# Patient Record
Sex: Male | Born: 1965 | Race: White | Hispanic: No | Marital: Married | Smoking: Current every day smoker
Health system: Southern US, Community
[De-identification: ages and names within clinical notes are randomized; demographics above are authoritative.]

## PROBLEM LIST (undated history)

## (undated) DIAGNOSIS — G40909 Epilepsy, unspecified, not intractable, without status epilepticus: Secondary | ICD-10-CM

## (undated) DIAGNOSIS — S069XAA Unspecified intracranial injury with loss of consciousness status unknown, initial encounter: Secondary | ICD-10-CM

## (undated) DIAGNOSIS — R51 Headache: Secondary | ICD-10-CM

## (undated) DIAGNOSIS — N529 Male erectile dysfunction, unspecified: Secondary | ICD-10-CM

## (undated) DIAGNOSIS — S069X9A Unspecified intracranial injury with loss of consciousness of unspecified duration, initial encounter: Secondary | ICD-10-CM

## (undated) DIAGNOSIS — F419 Anxiety disorder, unspecified: Secondary | ICD-10-CM

## (undated) HISTORY — PX: HEMORROIDECTOMY: SUR656

## (undated) HISTORY — DX: Headache: R51

## (undated) HISTORY — DX: Male erectile dysfunction, unspecified: N52.9

## (undated) HISTORY — DX: Unspecified intracranial injury with loss of consciousness of unspecified duration, initial encounter: S06.9X9A

## (undated) HISTORY — PX: OTHER SURGICAL HISTORY: SHX169

## (undated) HISTORY — DX: Anxiety disorder, unspecified: F41.9

## (undated) HISTORY — DX: Unspecified intracranial injury with loss of consciousness status unknown, initial encounter: S06.9XAA

## (undated) HISTORY — DX: Epilepsy, unspecified, not intractable, without status epilepticus: G40.909

---

## 2013-11-04 ENCOUNTER — Ambulatory Visit: Payer: Self-pay | Admitting: Neurology

## 2017-06-01 ENCOUNTER — Encounter: Payer: Self-pay | Admitting: Neurology

## 2017-06-01 ENCOUNTER — Ambulatory Visit: Payer: Medicaid Other | Admitting: Neurology

## 2017-06-01 DIAGNOSIS — G40909 Epilepsy, unspecified, not intractable, without status epilepticus: Secondary | ICD-10-CM

## 2017-06-01 DIAGNOSIS — R519 Headache, unspecified: Secondary | ICD-10-CM

## 2017-06-01 DIAGNOSIS — R51 Headache: Secondary | ICD-10-CM

## 2017-06-01 DIAGNOSIS — G441 Vascular headache, not elsewhere classified: Secondary | ICD-10-CM

## 2017-06-01 HISTORY — DX: Epilepsy, unspecified, not intractable, without status epilepticus: G40.909

## 2017-06-01 HISTORY — DX: Headache, unspecified: R51.9

## 2017-06-01 MED ORDER — IBUPROFEN 600 MG PO TABS: 600.0000 mg | ORAL_TABLET | Freq: Four times a day (QID) | ORAL | 1 refills | Status: DC | PRN

## 2017-06-01 MED ORDER — TOPIRAMATE 25 MG PO TABS: ORAL_TABLET | ORAL | 3 refills | Status: DC
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
## 2017-06-01 NOTE — Patient Instructions (Addendum)
   We will get MRI of the brain and get an EEG study.  We will start Topamax for the seizures and the headache.  Topamax (topiramate) is a seizure medication that has an FDA approval for seizures and for migraine headache. Potential side effects of this medication include weight loss, cognitive slowing, tingling in the fingers and toes, and carbonated drinks will taste bad. If any significant side effects are noted on this drug, please contact our office.

## 2017-06-12 ENCOUNTER — Telehealth: Payer: Self-pay | Admitting: Neurology

## 2017-06-12 NOTE — Telephone Encounter (Signed)
Patient has had recent blood work done through [facility].  The blood work was done on 21 December 2016.  Blood work includes a TSH of 1.29, white blood count of 5.3, hemoglobin 16.7, hematocrit 5 0.0, MCV 101, platelets of 179, PSA of 1.40, glucose of 88, BUN of 18, creatinine of 0.91, sodium 141, potassium 4.5, chloride 100, CO2 24, calcium 9.5, total protein of 7.0, albumin of 4.6, alkaline phosphatase of 112, AST 21, ALT 25.

## 2017-06-15 ENCOUNTER — Telehealth: Payer: Self-pay | Admitting: Neurology

## 2017-06-15 NOTE — Telephone Encounter (Signed)
I called the patient.  Left a message, I will call back later.

## 2017-06-15 NOTE — Telephone Encounter (Signed)
Pts wife [name] called saying that ibuprofen (ADVIL,MOTRIN) 600 MG tablet isn't  touching the pts headaches. Pt is wanting to up his dosage up to 800MG  or get something new

## 2017-06-15 NOTE — Telephone Encounter (Signed)
I called again, left a message again, I will call back tomorrow morning.

## 2017-06-15 NOTE — Telephone Encounter (Signed)
Pts wife is on break from 12-12:15 then at 2:15-2:30 and again at 4:30-4:45, if calling back today theses time frames would be ideal

## 2017-06-16 MED ORDER — IBUPROFEN 800 MG PO TABS: 800.0000 mg | ORAL_TABLET | Freq: Three times a day (TID) | ORAL | 2 refills | Status: DC | PRN

## 2017-06-16 NOTE — Addendum Note (Signed)
Addended by: Kathrynn Ducking on: 06/16/2017 07:41 AM   Modules accepted: Orders

## 2017-06-16 NOTE — Telephone Encounter (Signed)
I called the patient, talk with wife.  It appears that the patient is not just on one phenobarbital tablet daily, he takes 1 tablet 4 times daily, this will be change in the records.  The patient is to continue the phenobarbital.  The patient has not yet gotten up to the full dose of Topamax, he is still having headaches, hopefully the Topamax will help this with a higher dose, if not, they are to contact us for dose adjustments of the Topamax.  We will increase the ibuprofen to 800 mg tablets.  MRI of the brain is pending.

## 2017-06-21 ENCOUNTER — Other Ambulatory Visit: Payer: Medicaid Other

## 2017-07-05 ENCOUNTER — Encounter (INDEPENDENT_AMBULATORY_CARE_PROVIDER_SITE_OTHER): Payer: Self-pay

## 2017-07-05 ENCOUNTER — Ambulatory Visit: Payer: Medicaid Other | Admitting: Neurology

## 2017-07-05 ENCOUNTER — Telehealth: Payer: Self-pay | Admitting: Neurology

## 2017-07-05 DIAGNOSIS — G40909 Epilepsy, unspecified, not intractable, without status epilepticus: Secondary | ICD-10-CM

## 2017-07-05 MED ORDER — KETOROLAC TROMETHAMINE 10 MG PO TABS: 10.0000 mg | ORAL_TABLET | Freq: Three times a day (TID) | ORAL | 1 refills | Status: DC | PRN

## 2017-07-05 NOTE — Telephone Encounter (Signed)
I called the patient.  The EEG study shows intermittent bifrontal slowing.  MRI of the brain is pending.  He continues to have frequent headaches, he claims that the ibuprofen is not helpful.  The patient will be switched to Toradol to see if that helps.
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
## 2017-07-06 MED ORDER — DICLOFENAC POTASSIUM(MIGRAINE) 50 MG PO PACK: 50.0000 mg | PACK | Freq: Three times a day (TID) | ORAL | 2 refills | Status: DC | PRN

## 2017-07-06 NOTE — Telephone Encounter (Signed)
I called the patient.  The Toradol was too expensive, I will try diclofenac potassium 50 mg tablets to see if this helps.  They asked for a prescription for alprazolam, I do not write this prescription, this likely comes through his primary care physician.

## 2017-07-06 NOTE — Addendum Note (Signed)
Addended by: Kathrynn Ducking on: 07/06/2017 03:43 PM   Modules accepted: Orders

## 2017-07-06 NOTE — Telephone Encounter (Signed)
Toradol is going to [place] pt $30 and pts wife is wanting something cheaper or something that will be cover sent in. Pts wife is wondering if Dr. Jannifer Franklin will send in Xanax a little stronger dosage than .5 because they do work for pts headache and help pt sleep.

## 2017-07-10 ENCOUNTER — Ambulatory Visit
Admission: RE | Admit: 2017-07-10 | Discharge: 2017-07-10 | Disposition: A | Discharge status: Home / Self Care | Payer: Medicaid Other | Source: Ambulatory Visit | Attending: Neurology | Admitting: Neurology

## 2017-07-10 ENCOUNTER — Telehealth: Payer: Self-pay | Admitting: Neurology

## 2017-07-10 DIAGNOSIS — G40909 Epilepsy, unspecified, not intractable, without status epilepticus: Secondary | ICD-10-CM

## 2017-07-10 MED ORDER — GADOBENATE DIMEGLUMINE 529 MG/ML IV SOLN
13.0000 mL | Freq: Once | INTRAVENOUS | Status: AC | PRN
  Administered 2017-07-10: 13 mL via INTRAVENOUS

## 2017-07-10 NOTE — Telephone Encounter (Signed)
I called the patient.  The MRI of the brain is relatively unremarkable.  He is to remain on Topamax for the seizures, he continues to have daily headaches, I asked him to stop taking the [place] powders as this may be causing rebound headaches.  I indicated that opiate medications are contraindicated as he is having daily headaches, this would likely result in opiate addiction.

## 2017-08-19 ENCOUNTER — Other Ambulatory Visit: Payer: Self-pay | Admitting: Neurology

## 2017-09-19 ENCOUNTER — Telehealth: Payer: Self-pay | Admitting: Adult Health

## 2017-09-19 ENCOUNTER — Ambulatory Visit: Payer: Medicaid Other | Admitting: Adult Health

## 2017-09-19 ENCOUNTER — Encounter: Payer: Self-pay | Admitting: Adult Health

## 2017-09-19 ENCOUNTER — Other Ambulatory Visit: Payer: Self-pay | Admitting: Neurology

## 2017-09-19 VITALS — BP 140/88 | HR 60 | Ht 71.0 in | Wt 137.0 lb

## 2017-09-19 DIAGNOSIS — R51 Headache: Secondary | ICD-10-CM | POA: Diagnosis not present

## 2017-09-19 DIAGNOSIS — G40909 Epilepsy, unspecified, not intractable, without status epilepticus: Secondary | ICD-10-CM | POA: Diagnosis not present

## 2017-09-19 DIAGNOSIS — R519 Headache, unspecified: Secondary | ICD-10-CM

## 2017-09-19 MED ORDER — DICLOFENAC POTASSIUM(MIGRAINE) 50 MG PO PACK: 50.0000 mg | PACK | Freq: Three times a day (TID) | ORAL | 5 refills | Status: DC | PRN

## 2017-09-19 MED ORDER — TOPIRAMATE 25 MG PO TABS: 75.0000 mg | ORAL_TABLET | Freq: Two times a day (BID) | ORAL | 11 refills | Status: DC

## 2017-09-19 NOTE — Telephone Encounter (Signed)
I have called the pt's wife and made her aware that I have sent this med to the pharmacy she requested for a 6 mth supply.  There was no answer, LVM for the pt

## 2017-09-19 NOTE — Patient Instructions (Signed)
Your Plan:  Increase Topamax 75 mg twice a day Continue Diclofenac  If your symptoms worsen or you develop new symptoms please let us know.   Thank you for coming to see [place] at [facility]. I hope we have been able to provide you high quality care today.  You may receive a patient satisfaction survey over the next few weeks. We would appreciate your feedback and comments so that we may continue to improve ourselves and the health of our patients.

## 2017-09-19 NOTE — Progress Notes (Signed)
I have read the note, and I agree with the clinical assessment and plan.  Charles K Willis   

## 2017-09-19 NOTE — Telephone Encounter (Signed)
Pt wife requesting a refill for Diclofenac Potassium 50 MG PACK sent to [facility] for 6 months refill. Also requesting a call to make sure this problem is solved
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
## 2017-10-18 ENCOUNTER — Telehealth: Payer: Self-pay | Admitting: Adult Health

## 2017-10-18 NOTE — Telephone Encounter (Signed)
Called and spoke with patient's wife, [name] on [place] and advised her that her husband needs to call surgeon for approval off whether he can take any OTC for headaches. Advised her that NP will not recommend any medications. Wife stated he will take Goody powders which Dr Jannifer Franklin told him not to take. This RN advised that it a blood thinner and if Dr Jannifer Franklin told him not to take it, he shouldn't. She stated Tylenol doesn't help him. This RN advised again he call surgeon and follow surgeon's instructions.  Wife stated she will call surgeon, verbalized understanding.

## 2017-10-18 NOTE — Telephone Encounter (Signed)
That's correct. He can consult with his hand surgeon about the use of OTC medication prior to surgery

## 2017-10-18 NOTE — Telephone Encounter (Signed)
Pts wife called stating the pt will be having hand surgery on 5/24 and had been advised to come off of Diclofenac Potassium 50 MG PACK 5 days prior to surgery. [name] requesting a call back to see if there is anything the pt could take to help with his migraines with in the 5 day period. Please call to advise

## 2018-03-06 ENCOUNTER — Telehealth: Payer: Self-pay | Admitting: Adult Health

## 2018-03-06 ENCOUNTER — Other Ambulatory Visit: Payer: Self-pay | Admitting: *Deleted

## 2018-03-06 MED ORDER — DICLOFENAC SODIUM 50 MG PO TBEC: 50.0000 mg | DELAYED_RELEASE_TABLET | Freq: Three times a day (TID) | ORAL | 2 refills | Status: DC | PRN

## 2018-03-06 MED ORDER — DICLOFENAC POTASSIUM(MIGRAINE) 50 MG PO PACK: 50.0000 mg | PACK | Freq: Three times a day (TID) | ORAL | 2 refills | Status: DC | PRN

## 2018-03-06 NOTE — Addendum Note (Signed)
Addended by: Brandon Melnick on: 03/06/2018 03:07 PM   Modules accepted: Orders

## 2018-03-06 NOTE — Telephone Encounter (Signed)
I spoke to nancy at [facility].  No diclofenac packs.  Changed to 50mg  tablets ( diclofenac sodium) 1 tablet po TID prn # 90 with 2 refills.

## 2018-03-06 NOTE — Telephone Encounter (Signed)
Pt wife(on [place], [name]) has called for a refill on pt's Diclofenac Potassium 50 MG PACK, wife states pt will run out of this medication before scheduled appointment on 10-23, please send to  [place], [place] [facility] [place] [phone] (Phone) [phone] (Fax)

## 2018-03-06 NOTE — Telephone Encounter (Signed)
Nancy/[facility] [phone] does not have pack. Can this be swapped out for 50mg  tablets of voltaren? Please call to advise

## 2018-03-07 NOTE — Telephone Encounter (Signed)
Noted! Thank you

## 2018-03-07 NOTE — Addendum Note (Signed)
Addended by: Brandon Melnick on: 03/07/2018 11:14 AM   Modules accepted: Orders

## 2018-03-20 ENCOUNTER — Telehealth: Payer: Self-pay

## 2018-03-20 NOTE — Telephone Encounter (Signed)
Clearance form fax twice to [facility] at [phone].Form fax twice and confirmed.

## 2018-03-28 ENCOUNTER — Encounter: Payer: Self-pay | Admitting: Adult Health

## 2018-03-28 ENCOUNTER — Ambulatory Visit: Payer: Medicaid Other | Admitting: Adult Health

## 2018-03-28 VITALS — BP 123/74 | HR 60 | Ht 68.0 in | Wt 125.4 lb

## 2018-03-28 DIAGNOSIS — Z5181 Encounter for therapeutic drug level monitoring: Secondary | ICD-10-CM

## 2018-03-28 DIAGNOSIS — R51 Headache: Secondary | ICD-10-CM | POA: Diagnosis not present

## 2018-03-28 DIAGNOSIS — R519 Headache, unspecified: Secondary | ICD-10-CM

## 2018-03-28 DIAGNOSIS — G40909 Epilepsy, unspecified, not intractable, without status epilepticus: Secondary | ICD-10-CM

## 2018-03-28 MED ORDER — TOPIRAMATE 100 MG PO TABS: 100.0000 mg | ORAL_TABLET | Freq: Two times a day (BID) | ORAL | 3 refills | Status: DC

## 2018-03-28 NOTE — Progress Notes (Signed)
I have read the note, and I agree with the clinical assessment and plan.  Charles K Willis   

## 2018-03-28 NOTE — Patient Instructions (Signed)
Your Plan:  Continue Phenobarbital  Increase topamax 100 mg twice a day Blood work today If your symptoms worsen or you develop new symptoms please let us know.   Thank you for coming to see [place] at [facility]. I hope we have been able to provide you high quality care today.  You may receive a patient satisfaction survey over the next few weeks. We would appreciate your feedback and comments so that we may continue to improve ourselves and the health of our patients.

## 2018-03-28 NOTE — Progress Notes (Signed)
PATIENT: Benjamin [name] DOB: 01/26/1966  REASON FOR VISIT: follow up HISTORY FROM: patient  HISTORY OF PRESENT ILLNESS: Today 03/28/18 Benjamin [name] is a 52 year old male with a history of traumatic brain injury, seizures and daily headaches.  He returns today for follow-up.  He continues on phenobarbital and Topamax.  His wife states that he had a seizure on the way to the office today.  She states that he began "smacking" he lips in the car and repeating "God help me."  There was no convulsing in the extremities.  The patient denied biting his tongue or cheeks.  No loss of bowel or bladder.  Wife reports that the episode lasted less than 1 minute.  The patient denies missing any medications.  The patient seizures has never been under full control.  Wife reports that since last visit he has had 3 seizures including the one today.  The patient reports that he continues to have daily headaches.  He is unsure how much Topamax has helped but diclofenac has given him some benefit.  The patient continues to smoke cigarettes.  He denies alcohol use.  He returns today for evaluation.   HISTORY 09/19/17 Benjamin [name] is a 52 year old male with a history of traumatic brain injury, seizures and daily headaches.  He returns today for follow-up.  The patient's wife states that he had a seizure in February.   she states that the seizures consisted of him licking his lips, sweating and garbled speech.  She states once the event was over the patient was confused but eventually returned to his baseline.  She denies him missing any medications.  Patient reports that he continues to have daily headaches.  He contributes this to the facial injury he received in 1986.  Reports that he has to blow his nose daily which causes headaches.  He reports that diclofenac has been beneficial.  He is no longer taking Goody powders.  He continues on Topamax 50 mg twice a day as well as phenobarbital.  He returns today for an  evaluation.  REVIEW OF SYSTEMS: Out of a complete 14 system review of symptoms, the patient complains only of the following symptoms, and all other reviewed systems are negative.  Seizure  ALLERGIES: Allergies  Allergen Reactions  . Peanut-Containing Drug Products     Diverticulitis     HOME MEDICATIONS: Outpatient Medications Prior to Visit  Medication Sig Dispense Refill  . ALPRAZolam (XANAX) 0.5 MG tablet Take 0.5 mg by mouth at bedtime as needed for anxiety.    Marland Kitchen amitriptyline (ELAVIL) 25 MG tablet Take 25 mg by mouth at bedtime.    . diclofenac (VOLTAREN) 50 MG EC tablet Take 1 tablet (50 mg total) by mouth 3 (three) times daily as needed. 90 tablet 2  . omeprazole (PRILOSEC) 40 MG capsule Take 40 mg by mouth daily.    Marland Kitchen PHENobarbital (LUMINAL) 64.8 MG tablet Take 64.8 mg by mouth 4 (four) times daily.    . sildenafil (REVATIO) 20 MG tablet Take 20 mg by mouth 3 (three) times daily.    Marland Kitchen topiramate (TOPAMAX) 25 MG tablet Take 3 tablets (75 mg total) by mouth 2 (two) times daily. Begin taking one tablet twice a day for 2 weeks, then take 2 tablets twice a day 180 tablet 11  . fluticasone (FLONASE) 50 MCG/ACT nasal spray Place 1 spray into both nostrils daily.     No facility-administered medications prior to visit.     PAST MEDICAL HISTORY: Past Medical  History:  Diagnosis Date  . Anxiety   . ED (erectile dysfunction)   . Headache 06/01/2017  . Seizure disorder ([place]) 06/01/2017  . TBI (traumatic brain injury) ([place])    1986    PAST SURGICAL HISTORY: Past Surgical History:  Procedure Laterality Date  . colo-vesicular fistula     repair  . HEMORROIDECTOMY    . reconstructive surgery     right mandibular    FAMILY HISTORY: Family History  Problem Relation Age of Onset  . Hypercholesterolemia Mother   . Hypertension Mother   . Hypertension Father   . Heart attack Father   . Seizures Neg Hx     SOCIAL HISTORY: Social History   Socioeconomic History  .  Marital status: Married    Spouse name: Not on file  . Number of children: Not on file  . Years of education: Not on file  . Highest education level: Not on file  Occupational History  . Not on file  Social Needs  . Financial resource strain: Not on file  . Food insecurity:    Worry: Not on file    Inability: Not on file  . Transportation needs:    Medical: Not on file    Non-medical: Not on file  Tobacco Use  . Smoking status: Current Every Day Smoker    Packs/day: 1.00    Types: Cigarettes  . Smokeless tobacco: Never Used  Substance and Sexual Activity  . Alcohol use: No    Frequency: Never  . Drug use: No  . Sexual activity: Not on file  Lifestyle  . Physical activity:    Days per week: Not on file    Minutes per session: Not on file  . Stress: Not on file  Relationships  . Social connections:    Talks on phone: Not on file    Gets together: Not on file    Attends religious service: Not on file    Active member of club or organization: Not on file    Attends meetings of clubs or organizations: Not on file    Relationship status: Not on file  . Intimate partner violence:    Fear of current or ex partner: Not on file    Emotionally abused: Not on file    Physically abused: Not on file    Forced sexual activity: Not on file  Other Topics Concern  . Not on file  Social History Narrative  . Not on file      PHYSICAL EXAM  Vitals:   03/28/18 1300  BP: 123/74  Pulse: 60  Weight: 125 lb 6.4 oz (56.9 kg)  Height: 5\' 8"  (1.727 m)   Body mass index is 19.07 kg/m.  Generalized: Well developed, in no acute distress   Neurological examination  Mentation: Alert oriented to time, place, history taking. Follows all commands speech and language fluent Cranial nerve II-XII: Pupils were equal round reactive to light. Extraocular movements were full, visual field were full on confrontational test. Facial sensation and strength were normal. Uvula tongue midline. Head  turning and shoulder shrug  were normal and symmetric. Motor: The motor testing reveals 5 over 5 strength of all 4 extremities. Good symmetric motor tone is noted throughout.  Patient has a splint on the right arm. Sensory: Sensory testing is intact to soft touch on all 4 extremities. No evidence of extinction is noted.  Coordination: Cerebellar testing reveals good finger-nose-finger and heel-to-shin bilaterally.  Gait and station: Gait is normal.  Reflexes:  Deep tendon reflexes are symmetric and normal bilaterally.   DIAGNOSTIC DATA (LABS, IMAGING, TESTING) - I reviewed patient records, labs, notes, testing and imaging myself where available.   ASSESSMENT AND PLAN 52 y.o. year old male  has a past medical history of Anxiety, ED (erectile dysfunction), Headache (06/01/2017), Seizure disorder ([place]) (06/01/2017), and TBI (traumatic brain injury) ([place]). here with :  1.  Seizures 2.  History of traumatic brain injury 3.  Daily headaches  The patient will continue on phenobarbital.  I will increase Topamax to 100 mg twice a day.  We will see if this offers any benefit with his seizures as well as his headaches.  I will check blood work today.  He is advised that if his symptoms worsen or he develops new symptoms he should let us know.   Ward Givens, MSN, NP-C 03/28/2018, 1:13 PM [facility] [address], [place], [place] [address] [phone]

## 2018-03-29 LAB — TOPIRAMATE LEVEL: Topiramate Lvl: 2.8 ug/mL (ref 2.0–25.0)

## 2018-03-29 LAB — CBC WITH DIFFERENTIAL/PLATELET
Basophils Absolute: 0.1 10*3/uL (ref 0.0–0.2)
Basos: 2 %
EOS (ABSOLUTE): 0.2 10*3/uL (ref 0.0–0.4)
Eos: 3 %
Hematocrit: 43.9 % (ref 37.5–51.0)
Hemoglobin: 14.9 g/dL (ref 13.0–17.7)
IMMATURE GRANS (ABS): 0 10*3/uL (ref 0.0–0.1)
IMMATURE GRANULOCYTES: 0 %
LYMPHS: 33 %
Lymphocytes Absolute: 1.6 10*3/uL (ref 0.7–3.1)
MCH: 34.7 pg — ABNORMAL HIGH (ref 26.6–33.0)
MCHC: 33.9 g/dL (ref 31.5–35.7)
MCV: 102 fL — ABNORMAL HIGH (ref 79–97)
MONOS ABS: 0.5 10*3/uL (ref 0.1–0.9)
Monocytes: 9 %
NEUTROS PCT: 53 %
Neutrophils Absolute: 2.6 10*3/uL (ref 1.4–7.0)
Platelets: 181 10*3/uL (ref 150–450)
RBC: 4.3 x10E6/uL (ref 4.14–5.80)
RDW: 12.3 % (ref 12.3–15.4)
WBC: 5 10*3/uL (ref 3.4–10.8)

## 2018-03-29 LAB — COMPREHENSIVE METABOLIC PANEL
A/G RATIO: 2 (ref 1.2–2.2)
ALK PHOS: 90 IU/L (ref 39–117)
ALT: 18 IU/L (ref 0–44)
AST: 19 IU/L (ref 0–40)
Albumin: 4.7 g/dL (ref 3.5–5.5)
BUN/Creatinine Ratio: 25 — ABNORMAL HIGH (ref 9–20)
BUN: 24 mg/dL (ref 6–24)
Bilirubin Total: 0.2 mg/dL (ref 0.0–1.2)
CO2: 24 mmol/L (ref 20–29)
CREATININE: 0.97 mg/dL (ref 0.76–1.27)
Calcium: 9.3 mg/dL (ref 8.7–10.2)
Chloride: 105 mmol/L (ref 96–106)
GFR calc Af Amer: 102 mL/min/{1.73_m2} (ref >59)
GFR calc non Af Amer: 88 mL/min/{1.73_m2} (ref >59)
GLOBULIN, TOTAL: 2.3 g/dL (ref 1.5–4.5)
Glucose: 90 mg/dL (ref 65–99)
POTASSIUM: 4.3 mmol/L (ref 3.5–5.2)
SODIUM: 142 mmol/L (ref 134–144)
Total Protein: 7 g/dL (ref 6.0–8.5)

## 2018-03-29 LAB — PHENOBARBITAL LEVEL: PHENOBARBITAL, SERUM: 50 ug/mL — AB (ref 15–40)

## 2018-04-03 ENCOUNTER — Telehealth: Payer: Self-pay | Admitting: Adult Health

## 2018-04-03 DIAGNOSIS — G40909 Epilepsy, unspecified, not intractable, without status epilepticus: Secondary | ICD-10-CM

## 2018-04-03 NOTE — Telephone Encounter (Signed)
Spoke to wife.  Relayed that his PB level was elevated and MM/NP wanted to do a repeat level.  Trough level.  He will come by 04/16/2018 at 0800 (bring his medication with him and take after he has his blood drawn).  Order in system.  She verbalized understanding and will relay to pt.

## 2018-04-03 NOTE — Telephone Encounter (Signed)
Patient needs to come back in and had a repeat phenobarbital level.  His recent level was elevated.  All other blood work was unremarkable. This should be a trough level.

## 2018-04-16 ENCOUNTER — Other Ambulatory Visit (INDEPENDENT_AMBULATORY_CARE_PROVIDER_SITE_OTHER): Payer: Medicaid Other

## 2018-04-16 ENCOUNTER — Telehealth: Payer: Self-pay | Admitting: *Deleted

## 2018-04-16 DIAGNOSIS — Z0289 Encounter for other administrative examinations: Secondary | ICD-10-CM

## 2018-04-16 DIAGNOSIS — G40909 Epilepsy, unspecified, not intractable, without status epilepticus: Secondary | ICD-10-CM

## 2018-04-16 MED ORDER — DICLOFENAC SODIUM 50 MG PO TBEC: 50.0000 mg | DELAYED_RELEASE_TABLET | Freq: Three times a day (TID) | ORAL | 2 refills | Status: DC | PRN

## 2018-04-16 NOTE — Telephone Encounter (Signed)
Diclofenac r/f escribed to [place] per wife's request/fiim

## 2018-04-17 ENCOUNTER — Telehealth: Payer: Self-pay | Admitting: *Deleted

## 2018-04-17 LAB — PHENOBARBITAL LEVEL: Phenobarbital, Serum: 36 ug/mL (ref 15–40)

## 2018-04-17 NOTE — Telephone Encounter (Signed)
Spoke with wife, [name] on [place] and informed her that patient's Phenobarbital is now in normal range. He should continue taking medication as previously. She verbalized understanding, appreciation.

## 2018-06-26 ENCOUNTER — Telehealth: Payer: Self-pay | Admitting: Neurology

## 2018-06-26 ENCOUNTER — Ambulatory Visit: Payer: Medicaid Other | Admitting: Neurology

## 2018-06-26 NOTE — Telephone Encounter (Signed)
This patient cancelled the same day of a RV appointment.

## 2018-06-27 ENCOUNTER — Encounter: Payer: Self-pay | Admitting: Neurology

## 2018-06-29 ENCOUNTER — Ambulatory Visit: Payer: Medicaid Other | Admitting: Neurology

## 2018-06-29 ENCOUNTER — Encounter: Payer: Self-pay | Admitting: Neurology

## 2018-06-29 VITALS — BP 152/89 | HR 72 | Ht 68.0 in | Wt 122.0 lb

## 2018-06-29 DIAGNOSIS — G441 Vascular headache, not elsewhere classified: Secondary | ICD-10-CM

## 2018-06-29 DIAGNOSIS — G40909 Epilepsy, unspecified, not intractable, without status epilepticus: Secondary | ICD-10-CM | POA: Diagnosis not present

## 2018-06-29 MED ORDER — PHENOBARBITAL 64.8 MG PO TABS: 129.6000 mg | ORAL_TABLET | Freq: Two times a day (BID) | ORAL | 1 refills | Status: DC

## 2018-06-29 NOTE — Progress Notes (Signed)
Reason for visit: Seizures, headache  Benjamin [name] is an 53 y.o. male  History of present illness:  Benjamin [name] is a 53 year old right-handed white male with a history of traumatic brain injury in the past, he has intractable seizures and headaches.  He is on Topamax for seizures and headaches, he is on phenobarbital taking the 64.8 mg tablets 4 times a day.  The patient is hesitant to take his phenobarbital once or twice a day, he will put pills in his pocket or in his cigarette pack, he commonly will misplace the medication and miss a dose, and at that point he may have a seizure.  He has had 2 seizures since last seen, one on 27 December, and 1 on 15 June 2018.  Both seizures were associated with missed doses of medication.  The patient continues to have frequent headaches, he may take diclofenac for the headache.  The patient returns to this office for an evaluation.  Past Medical History:  Diagnosis Date  . Anxiety   . ED (erectile dysfunction)   . Headache 06/01/2017  . Seizure disorder ([place]) 06/01/2017  . TBI (traumatic brain injury) ([place])    1986    Past Surgical History:  Procedure Laterality Date  . colo-vesicular fistula     repair  . HEMORROIDECTOMY    . reconstructive surgery     right mandibular    Family History  Problem Relation Age of Onset  . Hypercholesterolemia Mother   . Hypertension Mother   . Hypertension Father   . Heart attack Father   . Seizures Neg Hx     Social history:  reports that he has been smoking cigarettes. He has been smoking about 1.00 pack per day. He has never used smokeless tobacco. He reports that he does not drink alcohol or use drugs.    Allergies  Allergen Reactions  . Peanut-Containing Drug Products     Diverticulitis     Medications:  Prior to Admission medications   Medication Sig Start Date End Date Taking? Authorizing Provider  ALPRAZolam Duanne Moron) 0.5 MG tablet Take 0.5 mg by mouth at bedtime as needed for anxiety.    Yes [provider]  amitriptyline (ELAVIL) 25 MG tablet Take 25 mg by mouth at bedtime.   Yes [provider]  diclofenac (VOLTAREN) 50 MG EC tablet Take 1 tablet (50 mg total) by mouth 3 (three) times daily as needed. 04/16/18  Yes Ward Givens, NP  omeprazole (PRILOSEC) 40 MG capsule Take 40 mg by mouth daily.   Yes [provider]  PHENobarbital (LUMINAL) 64.8 MG tablet Take 64.8 mg by mouth 4 (four) times daily.   Yes [provider]  sildenafil (REVATIO) 20 MG tablet Take 20 mg by mouth 3 (three) times daily.   Yes [provider]  topiramate (TOPAMAX) 100 MG tablet Take 1 tablet (100 mg total) by mouth 2 (two) times daily. 03/28/18  Yes Ward Givens, NP    ROS:  Out of a complete 14 system review of symptoms, the patient complains only of the following symptoms, and all other reviewed systems are negative.  Headache, seizures  Blood pressure (!) 152/89, pulse 72, height 5\' 8"  (1.727 m), weight 122 lb (55.3 kg).  Physical Exam  General: The patient is alert and cooperative at the time of the examination.  Skin: No significant peripheral edema is noted.   Neurologic Exam  Mental status: The patient is alert and oriented x 3 at the time of  the examination. The patient has apparent normal recent and remote memory, with an apparently normal attention span and concentration ability.   Cranial nerves: Facial symmetry is present. Speech is slightly dysarthric, not a phasic. Extraocular movements are full. Visual fields are full.  Motor: The patient has good strength in all 4 extremities.  Sensory examination: Soft touch sensation is symmetric on the face, arms, and legs.  Coordination: The patient has good finger-nose-finger and heel-to-shin bilaterally.  Gait and station: The patient has a normal gait. Tandem gait is slightly unsteady. Romberg is negative. No drift is seen.  Reflexes: Deep tendon reflexes are  symmetric.   Assessment/Plan:  1.  Intractable seizures  2.  Intractable headache  3.  History of traumatic brain injury  The patient is very hesitant to try to take the phenobarbital up once or twice a day, he will be given a prescription for phenobarbital, he may be willing to try taking 2 tablets twice daily for a while to see if he tolerates this.  The patient will continue on the Topamax, he will follow-up in 6 months.  If the seizures continue even if the patient is not missing doses, we may consider addition of Keppra, if the seizures continue, he may be a candidate for video EEG monitoring study for possible epilepsy surgery.  The patient will contact our office if any issues arise.  Jill Alexanders MD 06/29/2018 10:16 AM  [facility] [address] [place], [place] [address]  Phone [phone] Fax [phone]

## 2018-07-09 ENCOUNTER — Telehealth: Payer: Self-pay | Admitting: Neurology

## 2018-07-09 MED ORDER — DICLOFENAC SODIUM 50 MG PO TBEC: 50.0000 mg | DELAYED_RELEASE_TABLET | Freq: Three times a day (TID) | ORAL | 2 refills | Status: DC | PRN

## 2018-07-09 NOTE — Telephone Encounter (Signed)
Pt has been compliant with f/u on no changes made to Diclofenac 50 mg. Refill submitted via e-script to [facility].

## 2018-07-09 NOTE — Telephone Encounter (Signed)
I contacted the pt's wife Benjamin [name] (ok per dpr) advising I have completed the PA form and once Dr. Jannifer Franklin signs we will send to [facility] tracts requesting approval. Pt's wife was agreeable and will advise pt. She had no further questions/concerns.

## 2018-07-09 NOTE — Telephone Encounter (Signed)
Pt wife has called for a refill on the diclofenac (VOLTAREN) 50 MG EC tablet [facility]

## 2018-07-09 NOTE — Telephone Encounter (Signed)
Pt wife has called back to inform that she was told by [facility] that for the  diclofenac (VOLTAREN) 50 MG EC tablet  a PA is needed

## 2018-07-09 NOTE — Addendum Note (Signed)
Addended by: Verlin Grills T on: 07/09/2018 09:33 AM   Modules accepted: Orders

## 2018-07-10 NOTE — Telephone Encounter (Signed)
PA completed/signed by Dr. Jannifer Franklin and faxed to [facility]. F # [phone] confirmation received.

## 2018-07-13 ENCOUNTER — Telehealth: Payer: Self-pay | Admitting: Neurology

## 2018-07-13 MED ORDER — NAPROXEN 500 MG PO TABS: 500.0000 mg | ORAL_TABLET | Freq: Two times a day (BID) | ORAL | 3 refills | Status: DC

## 2018-07-13 NOTE — Telephone Encounter (Signed)
I have called in Naproxen 500mg  bid as needed for headache. Advise him take it after meal, not daily  His insurance would not cover diclofenac

## 2019-01-01 NOTE — Progress Notes (Signed)
PATIENT: Benjamin [name] DOB: 1966-01-27  REASON FOR VISIT: follow up HISTORY FROM: patient  HISTORY OF PRESENT ILLNESS: Today 01/02/19  Benjamin [name] is a 53 year old male with history of traumatic brain injury, now has intractable seizures and headaches.  He is on Topamax for seizures and headaches, phenobarbital taking 64.8 mg tablets 2 tablets a day.  Since last visit, he began to store his medications in a pillbox.  He is no longer keeping his pills in his cigarette pack.  He reports compliance on his medication, he has not missed any doses.  He says since last visit he had 1 seizure that was witnessed by his wife.  She describes it as lasting about 1 minute, he was staring off, broke out into a sweat.  There was no shaking.  He quickly returned back to normal.  She indicates this is the best he has done in years in regards to his seizure frequency.  He continues to complain of a right-sided headache, that is triggered after blowing his nose in the morning.  He says he has sinus problems after his traumatic brain injury.  He has been unable to tolerate diclofenac or naproxen.  He presents today for follow-up accompanied by his wife.  HISTORY 06/29/2018 Dr. Jannifer [name]: Benjamin [name] is a 53 year old right-handed white male with a history of traumatic brain injury in the past, he has intractable seizures and headaches.  He is on Topamax for seizures and headaches, he is on phenobarbital taking the 64.8 mg tablets 4 times a day.  The patient is hesitant to take his phenobarbital once or twice a day, he will put pills in his pocket or in his cigarette pack, he commonly will misplace the medication and miss a dose, and at that point he may have a seizure.  He has had 2 seizures since last seen, one on 27 December, and 1 on 15 June 2018.  Both seizures were associated with missed doses of medication.  The patient continues to have frequent headaches, he may take diclofenac for the headache.  The patient returns to  this office for an evaluation.  REVIEW OF SYSTEMS: Out of a complete 14 system review of symptoms, the patient complains only of the following symptoms, and all other reviewed systems are negative.  Seizures  ALLERGIES: Allergies  Allergen Reactions   Peanut-Containing Drug Products     Diverticulitis     HOME MEDICATIONS: Outpatient Medications Prior to Visit  Medication Sig Dispense Refill   ALPRAZolam (XANAX) 0.5 MG tablet Take 0.5 mg by mouth at bedtime as needed for anxiety.     amitriptyline (ELAVIL) 25 MG tablet Take 25 mg by mouth at bedtime.     omeprazole (PRILOSEC) 40 MG capsule Take 40 mg by mouth daily.     sildenafil (REVATIO) 20 MG tablet Take 20 mg by mouth 3 (three) times daily.     PHENobarbital (LUMINAL) 64.8 MG tablet Take 2 tablets (129.6 mg total) by mouth 2 (two) times daily. 360 tablet 1   topiramate (TOPAMAX) 100 MG tablet Take 1 tablet (100 mg total) by mouth 2 (two) times daily. 180 tablet 3   diclofenac (VOLTAREN) 50 MG EC tablet Take 1 tablet (50 mg total) by mouth 3 (three) times daily as needed. (Patient not taking: Reported on 01/02/2019) 90 tablet 2   naproxen (NAPROSYN) 500 MG tablet Take 1 tablet (500 mg total) by mouth 2 (two) times daily with a meal. (Patient not taking: Reported on 01/02/2019) 60 tablet  3   No facility-administered medications prior to visit.     PAST MEDICAL HISTORY: Past Medical History:  Diagnosis Date   Anxiety    ED (erectile dysfunction)    Headache 06/01/2017   Seizure disorder ([place]) 06/01/2017   TBI (traumatic brain injury) ([place])    1986    PAST SURGICAL HISTORY: Past Surgical History:  Procedure Laterality Date   colo-vesicular fistula     repair   HEMORROIDECTOMY     reconstructive surgery     right mandibular    FAMILY HISTORY: Family History  Problem Relation Age of Onset   Hypercholesterolemia Mother    Hypertension Mother    Hypertension Father    Heart attack Father     Seizures Neg Hx     SOCIAL HISTORY: Social History   Socioeconomic History   Marital status: Married    Spouse name: Not on file   Number of children: Not on file   Years of education: Not on file   Highest education level: Not on file  Occupational History   Not on file  Social Needs   Financial resource strain: Not on file   Food insecurity    Worry: Not on file    Inability: Not on file   Transportation needs    Medical: Not on file    Non-medical: Not on file  Tobacco Use   Smoking status: Current Every Day Smoker    Packs/day: 1.00    Types: Cigarettes   Smokeless tobacco: Never Used  Substance and Sexual Activity   Alcohol use: No    Frequency: Never   Drug use: No   Sexual activity: Not on file  Lifestyle   Physical activity    Days per week: Not on file    Minutes per session: Not on file   Stress: Not on file  Relationships   Social connections    Talks on phone: Not on file    Gets together: Not on file    Attends religious service: Not on file    Active member of club or organization: Not on file    Attends meetings of clubs or organizations: Not on file    Relationship status: Not on file   Intimate partner violence    Fear of current or ex partner: Not on file    Emotionally abused: Not on file    Physically abused: Not on file    Forced sexual activity: Not on file  Other Topics Concern   Not on file  Social History Narrative   Not on file      PHYSICAL EXAM  Vitals:   01/02/19 1450  BP: 130/78  Pulse: 76  Temp: 98.7 F (37.1 C)  Weight: 123 lb 3.2 oz (55.9 kg)  Height: 5\' 8"  (1.727 m)   Body mass index is 18.73 kg/m.  Generalized: Well developed, in no acute distress   Neurological examination  Mentation: Alert oriented to time, place, history taking. Follows all commands speech and language fluent Cranial nerve II-XII: Pupils were equal round reactive to light. Extraocular movements were full, visual field  were full on confrontational test. Facial sensation and strength were normal.  Head turning and shoulder shrug  were normal and symmetric. Motor: The motor testing reveals 5 over 5 strength of all 4 extremities. Good symmetric motor tone is noted throughout.  Sensory: Sensory testing is intact to soft touch on all 4 extremities. No evidence of extinction is noted.  Coordination: Cerebellar testing reveals  good finger-nose-finger and heel-to-shin bilaterally.  Gait and station: Gait is normal. Tandem gait is normal. Romberg is negative. No drift is seen.  Reflexes: Deep tendon reflexes are symmetric and normal bilaterally.   DIAGNOSTIC DATA (LABS, IMAGING, TESTING) - I reviewed patient records, labs, notes, testing and imaging myself where available.  Lab Results  Component Value Date   WBC 5.0 03/28/2018   HGB 14.9 03/28/2018   HCT 43.9 03/28/2018   MCV 102 (H) 03/28/2018   PLT 181 03/28/2018      Component Value Date/Time   NA 142 03/28/2018 1328   K 4.3 03/28/2018 1328   CL 105 03/28/2018 1328   CO2 24 03/28/2018 1328   GLUCOSE 90 03/28/2018 1328   BUN 24 03/28/2018 1328   CREATININE 0.97 03/28/2018 1328   CALCIUM 9.3 03/28/2018 1328   PROT 7.0 03/28/2018 1328   ALBUMIN 4.7 03/28/2018 1328   AST 19 03/28/2018 1328   ALT 18 03/28/2018 1328   ALKPHOS 90 03/28/2018 1328   BILITOT 0.2 03/28/2018 1328   GFRNONAA 88 03/28/2018 1328   GFRAA 102 03/28/2018 1328   No results found for: CHOL, HDL, LDLCALC, LDLDIRECT, TRIG, CHOLHDL No results found for: HGBA1C No results found for: VITAMINB12 No results found for: TSH    ASSESSMENT AND PLAN 53 y.o. year old male  has a past medical history of Anxiety, ED (erectile dysfunction), Headache (06/01/2017), Seizure disorder ([place]) (06/01/2017), and TBI (traumatic brain injury) ([place]). here with:  1.  Seizures 2.  Intractable headache 3.  History of traumatic brain injury  He has done quite well since his last visit, however he has  had 1 seizure.  He has done well to improve his medication storage habits, is now keeping his pills in a pillbox versus his cigarette pack.  He tells me he has been compliant with his medications and has not missed any doses.  He will continue taking phenobarbital 64.8 mg tablet, 2 tablets twice a day, Topamax 100 mg twice a day.  Given that he was compliant on his medications, but still had 1 seizure, we will start [place].  He will start Keppra 500 mg at bedtime for 1 week, then increase to 500 mg twice a day.  We discussed ordering a 72-hour video monitoring EEG for evaluation of possible epilepsy surgery.  He does not want to pursue this at this time, we can discuss this at next visit.  In regards to his reported headache, he says he has been unable to tolerate diclofenac for naproxen.  We will try Toradol.  I will check lab work today, including a phenobarbital level.  He will follow-up in 6 months or sooner if needed.  I advised that if his symptoms worsen or if he develops any new symptoms he should let us know.   I spent 25 minutes with the patient. 50% of this time was spent discussing his plan of care.   Butler Denmark, AGNP-C, DNP 01/02/2019, 4:09 PM [facility] [address] [place], [place] [address] [phone]

## 2019-01-02 ENCOUNTER — Other Ambulatory Visit: Payer: Self-pay

## 2019-01-02 ENCOUNTER — Encounter: Payer: Self-pay | Admitting: Neurology

## 2019-01-02 ENCOUNTER — Telehealth: Payer: Self-pay | Admitting: Neurology

## 2019-01-02 ENCOUNTER — Ambulatory Visit: Payer: Medicaid Other | Admitting: Neurology

## 2019-01-02 VITALS — BP 130/78 | HR 76 | Temp 98.7°F | Ht 68.0 in | Wt 123.2 lb

## 2019-01-02 DIAGNOSIS — G40909 Epilepsy, unspecified, not intractable, without status epilepticus: Secondary | ICD-10-CM | POA: Diagnosis not present

## 2019-01-02 DIAGNOSIS — G441 Vascular headache, not elsewhere classified: Secondary | ICD-10-CM

## 2019-01-02 MED ORDER — KETOROLAC TROMETHAMINE 10 MG PO TABS: 10.0000 mg | ORAL_TABLET | Freq: Three times a day (TID) | ORAL | 1 refills | Status: DC | PRN

## 2019-01-02 MED ORDER — PHENOBARBITAL 64.8 MG PO TABS: 129.6000 mg | ORAL_TABLET | Freq: Two times a day (BID) | ORAL | 0 refills | Status: AC

## 2019-01-02 MED ORDER — LEVETIRACETAM 500 MG PO TABS: ORAL_TABLET | ORAL | 5 refills | Status: DC

## 2019-01-02 MED ORDER — TOPIRAMATE 100 MG PO TABS: 100.0000 mg | ORAL_TABLET | Freq: Two times a day (BID) | ORAL | 3 refills | Status: DC

## 2019-01-02 NOTE — Progress Notes (Signed)
I have read the note, and I agree with the clinical assessment and plan.  Charles K Willis   

## 2019-01-02 NOTE — Telephone Encounter (Signed)
Pt wife is asking for a call from RN to discuss  the 2 meds called in by NP Sarah are anti inflammatory. Please call

## 2019-01-02 NOTE — Patient Instructions (Addendum)
It was nice to see you both today. You will start taking Keppra 500 mg at bedtime x 1 week, then take 1 tablet twice a day, In combination with phenobarbital and topamax at current dose. I will check lab work today. Try toradol for headache pain. I will see you in 6 months.

## 2019-01-03 ENCOUNTER — Telehealth: Payer: Self-pay | Admitting: *Deleted

## 2019-01-03 LAB — COMPREHENSIVE METABOLIC PANEL
ALT: 10 IU/L (ref 0–44)
AST: 15 IU/L (ref 0–40)
Albumin/Globulin Ratio: 2.3 — ABNORMAL HIGH (ref 1.2–2.2)
Albumin: 4.6 g/dL (ref 3.8–4.9)
Alkaline Phosphatase: 94 IU/L (ref 39–117)
BUN/Creatinine Ratio: 20 (ref 9–20)
BUN: 20 mg/dL (ref 6–24)
Bilirubin Total: <0.2 mg/dL (ref 0.0–1.2)
CO2: 22 mmol/L (ref 20–29)
Calcium: 9.5 mg/dL (ref 8.7–10.2)
Chloride: 105 mmol/L (ref 96–106)
Creatinine, Ser: 1.02 mg/dL (ref 0.76–1.27)
GFR calc Af Amer: 95 mL/min/{1.73_m2} (ref >59)
GFR calc non Af Amer: 82 mL/min/{1.73_m2} (ref >59)
Globulin, Total: 2 g/dL (ref 1.5–4.5)
Glucose: 75 mg/dL (ref 65–99)
Potassium: 3.9 mmol/L (ref 3.5–5.2)
Sodium: 144 mmol/L (ref 134–144)
Total Protein: 6.6 g/dL (ref 6.0–8.5)

## 2019-01-03 LAB — CBC WITH DIFFERENTIAL/PLATELET
Basophils Absolute: 0.1 10*3/uL (ref 0.0–0.2)
Basos: 1 %
EOS (ABSOLUTE): 0.2 10*3/uL (ref 0.0–0.4)
Eos: 4 %
Hematocrit: 41.7 % (ref 37.5–51.0)
Hemoglobin: 14.3 g/dL (ref 13.0–17.7)
Immature Grans (Abs): 0 10*3/uL (ref 0.0–0.1)
Immature Granulocytes: 0 %
Lymphocytes Absolute: 2 10*3/uL (ref 0.7–3.1)
Lymphs: 31 %
MCH: 33.8 pg — ABNORMAL HIGH (ref 26.6–33.0)
MCHC: 34.3 g/dL (ref 31.5–35.7)
MCV: 99 fL — ABNORMAL HIGH (ref 79–97)
Monocytes Absolute: 0.7 10*3/uL (ref 0.1–0.9)
Monocytes: 10 %
Neutrophils Absolute: 3.6 10*3/uL (ref 1.4–7.0)
Neutrophils: 54 %
Platelets: 209 10*3/uL (ref 150–450)
RBC: 4.23 x10E6/uL (ref 4.14–5.80)
RDW: 12.2 % (ref 11.6–15.4)
WBC: 6.6 10*3/uL (ref 3.4–10.8)

## 2019-01-03 LAB — PHENOBARBITAL LEVEL: Phenobarbital, Serum: 46 ug/mL (ref 15–40)

## 2019-01-03 NOTE — Telephone Encounter (Signed)
Spoke to wife and relayed that lab results per below to pt, per SS/NP.  She verbalized understanding.

## 2019-01-03 NOTE — Telephone Encounter (Signed)
-----   Message from Suzzanne Cloud, NP sent at 01/03/2019  8:01 AM EDT ----- Please call the patient. Labs look okay, his phenobarbital level is mildly elevated at 46. I checked with Dr. Jannifer Franklin, will not change the dose at this time. He is tolerating without side effect and indicates he is taking the medication.

## 2019-01-03 NOTE — Telephone Encounter (Signed)
Spoke to pts wife. Concern of taking new medication keppra and toradol together.  She stated Benjamin [name] at [facility] stated not to take both nsaids together.  I relayed that I would call and see specifically what he was saying.  I spoke to Benjamin [name] at [facility] he stated that he relayed to them that not to take meloxicam or toradol together as both are nsaids.  I relayed to him per pt and wife, they brought both bottles and stated he was not taking the meloxicam or diclofenac anymore.  I called wife back and relayed that he is ok to take keppra and toradol.  The meloxicam and diclofenac are not to be taken with toradol (as all three are nsaids).  She verbalized understanding.

## 2019-02-19 ENCOUNTER — Other Ambulatory Visit: Payer: Self-pay | Admitting: *Deleted

## 2019-02-19 MED ORDER — KETOROLAC TROMETHAMINE 10 MG PO TABS: 10.0000 mg | ORAL_TABLET | Freq: Three times a day (TID) | ORAL | 1 refills | Status: DC | PRN

## 2019-02-19 NOTE — Telephone Encounter (Signed)
I called the pt's wife Benjamin [name] (on [place]) at [phone] and LVM (Ok per [facility]) informing her that [name] NP refilled the Toradol to [facility]. I left a friendly reminder on message that pt is not to be taking the Meloxicam in combination with the Toradol. Toradol is the only NSAID he should be taking, and this is not a daily medication. It is only to be taken as needed for a headache. I left the office number in the message for a call back if she has any questions.

## 2019-02-19 NOTE — Telephone Encounter (Signed)
Ok, to fill Toradol for headache. I have on file he could not tolerate naproxen or diclofenac. He is not to be taking meloxicam in combination. Toradol is the only NSAID he should be taking. This is prescribed for his headaches, should not be taking daily. I sent the prescription.

## 2019-02-19 NOTE — Telephone Encounter (Signed)
Pt's wife [name] (on [place]) called asking for a refill of pt's Toradol. She would like a call back to discuss something else if the Toradol cannot be refilled.

## 2019-05-08 ENCOUNTER — Telehealth: Payer: Self-pay | Admitting: Neurology

## 2019-05-08 NOTE — Telephone Encounter (Signed)
Pt is needing a refill on his ketorolac (TORADOL) 10 MG tablet sent to the [facility]

## 2019-05-09 MED ORDER — KETOROLAC TROMETHAMINE 10 MG PO TABS: 10.0000 mg | ORAL_TABLET | Freq: Three times a day (TID) | ORAL | 0 refills | Status: DC | PRN

## 2019-05-09 NOTE — Telephone Encounter (Signed)
Please get him scheduled for follow-up will send in short term fill.

## 2019-05-09 NOTE — Telephone Encounter (Signed)
Pts wife [name] is calling in to check status of refill

## 2019-05-09 NOTE — Telephone Encounter (Signed)
Unable to get in contact with the patient. LVM asking him to return our call so that we could schedule him an office visit. Office number was provided.

## 2019-05-29 ENCOUNTER — Other Ambulatory Visit: Payer: Self-pay

## 2019-05-29 ENCOUNTER — Ambulatory Visit: Payer: Medicaid Other | Admitting: Neurology

## 2019-05-29 ENCOUNTER — Encounter: Payer: Self-pay | Admitting: Neurology

## 2019-05-29 VITALS — BP 137/92 | HR 75 | Temp 97.3°F | Ht 68.0 in | Wt 120.4 lb

## 2019-05-29 DIAGNOSIS — G44321 Chronic post-traumatic headache, intractable: Secondary | ICD-10-CM | POA: Diagnosis not present

## 2019-05-29 DIAGNOSIS — G40909 Epilepsy, unspecified, not intractable, without status epilepticus: Secondary | ICD-10-CM

## 2019-05-29 MED ORDER — KETOROLAC TROMETHAMINE 10 MG PO TABS: 10.0000 mg | ORAL_TABLET | Freq: Three times a day (TID) | ORAL | 1 refills | Status: DC | PRN

## 2019-05-29 MED ORDER — LEVETIRACETAM 500 MG PO TABS: ORAL_TABLET | ORAL | 5 refills | Status: DC

## 2019-05-29 NOTE — Progress Notes (Signed)
I have read the note, and I agree with the clinical assessment and plan.  Jacee Enerson K Fitzhugh Vizcarrondo   
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
## 2019-11-12 ENCOUNTER — Telehealth: Payer: Self-pay | Admitting: Neurology

## 2019-11-12 MED ORDER — LEVETIRACETAM 500 MG PO TABS: ORAL_TABLET | ORAL | 0 refills | Status: AC

## 2019-11-12 MED ORDER — TOPIRAMATE 100 MG PO TABS: 100.0000 mg | ORAL_TABLET | Freq: Two times a day (BID) | ORAL | 0 refills | Status: AC

## 2019-11-12 NOTE — Addendum Note (Signed)
Addended by: Brandon Melnick on: 11/12/2019 03:17 PM   Modules accepted: Orders

## 2019-11-12 NOTE — Telephone Encounter (Signed)
Patient was scheduled to see Dr. Jannifer Franklin ON 11/28/19 but he is not able to make that appt so he is scheduled to see Sarah on 12/24/19. But he will be out of  Keppra & Topamax the end of June and he is wondering if he can get a refill while he is waiting to see [name] in July.

## 2019-11-12 NOTE — Telephone Encounter (Signed)
Done for 90 day supply.

## 2019-11-28 ENCOUNTER — Ambulatory Visit: Payer: Medicaid Other | Admitting: Neurology

## 2019-12-24 ENCOUNTER — Encounter: Payer: Self-pay | Admitting: Neurology

## 2019-12-24 ENCOUNTER — Ambulatory Visit: Payer: Medicaid Other | Admitting: Neurology
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
## 2020-11-04 ENCOUNTER — Inpatient Hospital Stay (HOSPITAL_COMMUNITY)
Admission: AD | Admit: 2020-11-04 | Discharge: 2020-11-28 | DRG: 070 | Disposition: A | Discharge status: Hospice | Payer: Medicaid Other | Source: Other Acute Inpatient Hospital | Attending: Hospitalist | Admitting: Hospitalist

## 2020-11-04 DIAGNOSIS — G0481 Other encephalitis and encephalomyelitis: Secondary | ICD-10-CM | POA: Diagnosis not present

## 2020-11-04 DIAGNOSIS — D491 Neoplasm of unspecified behavior of respiratory system: Secondary | ICD-10-CM | POA: Diagnosis not present

## 2020-11-04 DIAGNOSIS — Z515 Encounter for palliative care: Secondary | ICD-10-CM | POA: Diagnosis not present

## 2020-11-04 DIAGNOSIS — F419 Anxiety disorder, unspecified: Secondary | ICD-10-CM | POA: Diagnosis present

## 2020-11-04 DIAGNOSIS — Z66 Do not resuscitate: Secondary | ICD-10-CM | POA: Diagnosis not present

## 2020-11-04 DIAGNOSIS — Y92029 Unspecified place in mobile home as the place of occurrence of the external cause: Secondary | ICD-10-CM | POA: Diagnosis not present

## 2020-11-04 DIAGNOSIS — R197 Diarrhea, unspecified: Secondary | ICD-10-CM | POA: Diagnosis not present

## 2020-11-04 DIAGNOSIS — T426X6A Underdosing of other antiepileptic and sedative-hypnotic drugs, initial encounter: Secondary | ICD-10-CM | POA: Diagnosis present

## 2020-11-04 DIAGNOSIS — E43 Unspecified severe protein-calorie malnutrition: Secondary | ICD-10-CM | POA: Diagnosis not present

## 2020-11-04 DIAGNOSIS — S92402A Displaced unspecified fracture of left great toe, initial encounter for closed fracture: Secondary | ICD-10-CM | POA: Diagnosis present

## 2020-11-04 DIAGNOSIS — S069X0S Unspecified intracranial injury without loss of consciousness, sequela: Secondary | ICD-10-CM

## 2020-11-04 DIAGNOSIS — Z7189 Other specified counseling: Secondary | ICD-10-CM | POA: Diagnosis not present

## 2020-11-04 DIAGNOSIS — R4587 Impulsiveness: Secondary | ICD-10-CM | POA: Diagnosis present

## 2020-11-04 DIAGNOSIS — Z20822 Contact with and (suspected) exposure to covid-19: Secondary | ICD-10-CM | POA: Diagnosis not present

## 2020-11-04 DIAGNOSIS — K5900 Constipation, unspecified: Secondary | ICD-10-CM | POA: Diagnosis not present

## 2020-11-04 DIAGNOSIS — R54 Age-related physical debility: Secondary | ICD-10-CM | POA: Diagnosis present

## 2020-11-04 DIAGNOSIS — G9349 Other encephalopathy: Secondary | ICD-10-CM | POA: Diagnosis not present

## 2020-11-04 DIAGNOSIS — F28 Other psychotic disorder not due to a substance or known physiological condition: Secondary | ICD-10-CM | POA: Diagnosis not present

## 2020-11-04 DIAGNOSIS — C3402 Malignant neoplasm of left main bronchus: Secondary | ICD-10-CM | POA: Diagnosis present

## 2020-11-04 DIAGNOSIS — Z9119 Patient's noncompliance with other medical treatment and regimen: Secondary | ICD-10-CM

## 2020-11-04 DIAGNOSIS — G4700 Insomnia, unspecified: Secondary | ICD-10-CM | POA: Diagnosis present

## 2020-11-04 DIAGNOSIS — R471 Dysarthria and anarthria: Secondary | ICD-10-CM | POA: Diagnosis not present

## 2020-11-04 DIAGNOSIS — G4089 Other seizures: Secondary | ICD-10-CM | POA: Diagnosis not present

## 2020-11-04 DIAGNOSIS — Z781 Physical restraint status: Secondary | ICD-10-CM

## 2020-11-04 DIAGNOSIS — Z681 Body mass index (BMI) 19 or less, adult: Secondary | ICD-10-CM | POA: Diagnosis not present

## 2020-11-04 DIAGNOSIS — Z8349 Family history of other endocrine, nutritional and metabolic diseases: Secondary | ICD-10-CM

## 2020-11-04 DIAGNOSIS — R64 Cachexia: Secondary | ICD-10-CM | POA: Diagnosis not present

## 2020-11-04 DIAGNOSIS — M79673 Pain in unspecified foot: Secondary | ICD-10-CM | POA: Diagnosis not present

## 2020-11-04 DIAGNOSIS — G40909 Epilepsy, unspecified, not intractable, without status epilepticus: Secondary | ICD-10-CM | POA: Diagnosis not present

## 2020-11-04 DIAGNOSIS — S92492A Other fracture of left great toe, initial encounter for closed fracture: Secondary | ICD-10-CM

## 2020-11-04 DIAGNOSIS — Z8249 Family history of ischemic heart disease and other diseases of the circulatory system: Secondary | ICD-10-CM

## 2020-11-04 DIAGNOSIS — F688 Other specified disorders of adult personality and behavior: Secondary | ICD-10-CM | POA: Diagnosis present

## 2020-11-04 DIAGNOSIS — W208XXA Other cause of strike by thrown, projected or falling object, initial encounter: Secondary | ICD-10-CM | POA: Diagnosis present

## 2020-11-04 DIAGNOSIS — F17210 Nicotine dependence, cigarettes, uncomplicated: Secondary | ICD-10-CM | POA: Diagnosis present

## 2020-11-04 DIAGNOSIS — R413 Other amnesia: Secondary | ICD-10-CM | POA: Diagnosis present

## 2020-11-04 DIAGNOSIS — R451 Restlessness and agitation: Secondary | ICD-10-CM | POA: Diagnosis present

## 2020-11-04 DIAGNOSIS — G9340 Encephalopathy, unspecified: Secondary | ICD-10-CM | POA: Diagnosis not present

## 2020-11-04 DIAGNOSIS — Z91138 Patient's unintentional underdosing of medication regimen for other reason: Secondary | ICD-10-CM

## 2020-11-04 DIAGNOSIS — Z79899 Other long term (current) drug therapy: Secondary | ICD-10-CM

## 2020-11-04 DIAGNOSIS — R4182 Altered mental status, unspecified: Secondary | ICD-10-CM | POA: Diagnosis not present

## 2020-11-04 DIAGNOSIS — R52 Pain, unspecified: Secondary | ICD-10-CM

## 2020-11-04 NOTE — Progress Notes (Signed)
Pt arrived to [facility]. Alert and oriented to self only. Identified appropriately. VS stable, no acute distress noted, denied chest pain, SOB. Admission notified about pt arriving to the floor.  Pt oriented to room and equipment, instructed to use call bell for assistance, and call bell left within pt reach. Bed alarm in place.  Cardiac monitor in place and CCMD notified. Will continue to monitor pt and treat per MD orders.

## 2020-11-05 ENCOUNTER — Encounter (HOSPITAL_COMMUNITY): Payer: Self-pay | Admitting: Internal Medicine

## 2020-11-05 ENCOUNTER — Inpatient Hospital Stay (HOSPITAL_COMMUNITY): Payer: Medicaid Other

## 2020-11-05 DIAGNOSIS — S92492A Other fracture of left great toe, initial encounter for closed fracture: Secondary | ICD-10-CM

## 2020-11-05 DIAGNOSIS — G9340 Encephalopathy, unspecified: Secondary | ICD-10-CM

## 2020-11-05 DIAGNOSIS — D491 Neoplasm of unspecified behavior of respiratory system: Secondary | ICD-10-CM

## 2020-11-05 DIAGNOSIS — M79673 Pain in unspecified foot: Secondary | ICD-10-CM

## 2020-11-05 LAB — COMPREHENSIVE METABOLIC PANEL
ALT: 13 U/L (ref 0–44)
AST: 15 U/L (ref 15–41)
Albumin: 2.8 g/dL — ABNORMAL LOW (ref 3.5–5.0)
Alkaline Phosphatase: 70 U/L (ref 38–126)
Anion gap: 7 (ref 5–15)
BUN: 18 mg/dL (ref 6–20)
CO2: 27 mmol/L (ref 22–32)
Calcium: 8.4 mg/dL — ABNORMAL LOW (ref 8.9–10.3)
Chloride: 106 mmol/L (ref 98–111)
Creatinine, Ser: 1 mg/dL (ref 0.61–1.24)
GFR, Estimated: >60 mL/min (ref >60)
Glucose, Bld: 104 mg/dL — ABNORMAL HIGH (ref 70–99)
Potassium: 3.4 mmol/L — ABNORMAL LOW (ref 3.5–5.1)
Sodium: 140 mmol/L (ref 135–145)
Total Bilirubin: 0.3 mg/dL (ref 0.3–1.2)
Total Protein: 5.9 g/dL — ABNORMAL LOW (ref 6.5–8.1)

## 2020-11-05 LAB — CBC
HCT: 33 % — ABNORMAL LOW (ref 39.0–52.0)
Hemoglobin: 10.7 g/dL — ABNORMAL LOW (ref 13.0–17.0)
MCH: 32.2 pg (ref 26.0–34.0)
MCHC: 32.4 g/dL (ref 30.0–36.0)
MCV: 99.4 fL (ref 80.0–100.0)
Platelets: 248 10*3/uL (ref 150–400)
RBC: 3.32 MIL/uL — ABNORMAL LOW (ref 4.22–5.81)
RDW: 11.9 % (ref 11.5–15.5)
WBC: 5.3 10*3/uL (ref 4.0–10.5)
nRBC: 0 % (ref 0.0–0.2)

## 2020-11-05 LAB — HIV ANTIBODY (ROUTINE TESTING W REFLEX): HIV Screen 4th Generation wRfx: NONREACTIVE

## 2020-11-05 MED ORDER — ONDANSETRON HCL 4 MG/2ML IJ SOLN
4.0000 mg | Freq: Four times a day (QID) | INTRAMUSCULAR | Status: DC | PRN
  Administered 2020-11-17: 4 mg via INTRAVENOUS
  Filled 2020-11-05: qty 2

## 2020-11-05 MED ORDER — LACTATED RINGERS IV SOLN: INTRAVENOUS | Status: DC

## 2020-11-05 MED ORDER — ACETAMINOPHEN 650 MG RE SUPP: 650.0000 mg | Freq: Four times a day (QID) | RECTAL | Status: DC | PRN

## 2020-11-05 MED ORDER — LEVETIRACETAM IN NACL 500 MG/100ML IV SOLN
500.0000 mg | Freq: Two times a day (BID) | INTRAVENOUS | Status: DC
  Administered 2020-11-05 – 2020-11-13 (×19): 500 mg via INTRAVENOUS
  Filled 2020-11-05 (×21): qty 100

## 2020-11-05 MED ORDER — ACETAMINOPHEN 325 MG PO TABS
650.0000 mg | ORAL_TABLET | Freq: Four times a day (QID) | ORAL | Status: DC | PRN
  Administered 2020-11-07 – 2020-11-21 (×7): 650 mg via ORAL
  Filled 2020-11-05 (×9): qty 2

## 2020-11-05 MED ORDER — ENOXAPARIN SODIUM 40 MG/0.4ML IJ SOSY
40.0000 mg | PREFILLED_SYRINGE | INTRAMUSCULAR | Status: DC
  Administered 2020-11-05 – 2020-11-22 (×13): 40 mg via SUBCUTANEOUS
  Filled 2020-11-05 (×18): qty 0.4

## 2020-11-05 MED ORDER — ONDANSETRON HCL 4 MG PO TABS
4.0000 mg | ORAL_TABLET | Freq: Four times a day (QID) | ORAL | Status: DC | PRN
  Administered 2020-11-21 – 2020-11-24 (×2): 4 mg via ORAL
  Filled 2020-11-05 (×3): qty 1

## 2020-11-05 MED ORDER — ENOXAPARIN SODIUM 30 MG/0.3ML IJ SOSY
30.0000 mg | PREFILLED_SYRINGE | INTRAMUSCULAR | Status: DC
  Filled 2020-11-05: qty 0.3

## 2020-11-05 NOTE — Progress Notes (Signed)
EEG Completed; Results Pending  
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
## 2020-11-05 NOTE — Progress Notes (Signed)
Pt refused iv placement.

## 2020-11-05 NOTE — Progress Notes (Signed)
PROGRESS NOTE    Benjamin [name]  LFY:[id number] DOB: [birth year] DOA: 11/04/2020 PCP: Glenda Chroman, MD   Brief Narrative:  Benjamin [name] is a 55 y.o. male with medical history significant for seizure disorder who presented to [facility] emergency room with altered mental status.  His wife reported that he had been acting very erratically.  He was complaining of pain in his left foot.  Reportedly he had an injury to his left foot at home in the last day or 2 and has had pain in the foot since then.  He was found to have fracture of his left first toe.  Placed in a postop shoe in the emergency room.  He had an erratic behavior and was very anxious and uncooperative and he was dosed with Haldol Ativan and Benadryl in the emergency room.  Chest x-ray revealed a lingular pneumonia versus a central lung mass.  CT of his chest was obtained which showed a mass in the left lingula and surrounding postobstructive pneumonitis.  The mass extended into the left hilum and he had left hilar adenopathy.  He also had a mass in the right upper lobe.  MRI was obtained to make sure there is no metastasis that would be causing his symptoms.  MRI did not reveal any intracranial lesions.  There was no interventional radiology, pulmonology, oncology available at [facility] for further work-up so patient was transferred to [facility].   Assessment & Plan:   Principal Problem:   Encephalopathy acute Active Problems:   Seizure disorder ([place])   Lung neoplasm   Other fracture of left great toe, initial encounter for closed fracture   Encephalopathy   Acute versus subacute encephalopathy, unclear etiology, POA  -Unclear timeframe for symptoms, at least 2 days if not weeks per discussion with family  -Patient has no history of seizure, indicates he has been noncompliant with medications which may account for some of his mental status changes over the past few days  -MRI at previous facility unremarkable  -EEG  pending -Patient also admits to smoking marijuana, denies other illicit substances but may have potential for withdrawal, denies alcohol -Continue to search for metabolic and organic causes, if none can be obtained in the next 24 to 48 hours we will consider psychiatric consult, patient today is more awake alert and reasonable but still has episodes of combativeness, impulsiveness and remains a danger to himself as he continues to pull out IVs, pull off monitoring, and is somewhat aggressive towards staff both verbally and physically  Incidental lung nodule - On routine imaging CT of chest showed to have nodules concerning for cancer  -Will sideline oncology, unclear if patient needs further invasive testing while inpatient versus outpatient follow-up  -May benefit from further imaging including abdomen pelvis to exclude any other primary sources or metastatic lesions  Seizure disorder  -Patient admits to being on Keppra twice daily with very poor compliance  -Continue IV Keppra until alert and agreeable to take p.o. medications   Other fracture of left great toe, initial encounter for closed fracture Post op shoe was placed in the emergency room and will be continued.  No further indication for imaging or intervention at this time.  DVT prophylaxis:  Lovenox Code Status:  Full code  Family Communication: None present  Status is: Inpatient  Dispo: The patient is from: Home              Anticipated d/c is to: To be determined  Anticipated d/c date is: 48 to 72 hours              Patient currently not medically stable for discharge  Consultants:   None  Procedures:   None  Antimicrobials:  None indicated  Subjective: Overnight patient remained markedly combative, in restraints this morning, he denies any overt complaints of nausea vomiting chest pain shortness of breath headache fevers or chills.  He does have somewhat circumferential language, continues to focus on  family members who were not present asking staff to walk by the room for specific items and is somewhat noncompliant with our discussion today he is alert to person and general situation understands he is in the hospital but thinks he is at [facility] in another city.  Objective: Vitals:   11/04/20 2343 11/05/20 0559  BP: 121/79 103/62  Pulse: 80 71  Resp: 18 18  Temp: 98.4 F (36.9 C) 98.5 F (36.9 C)  TempSrc: Oral   SpO2: 97% 97%  Weight: 51.5 kg   Height: 5\' 7"  (1.702 m)     Intake/Output Summary (Last 24 hours) at 11/05/2020 0720 Last data filed at 11/05/2020 0300 Gross per 24 hour  Intake 37.18 ml  Output --  Net 37.18 ml   Filed Weights   11/04/20 2343  Weight: 51.5 kg    Examination:  General:  Pleasantly resting in bed, No acute distress.  Cachectic appearing HEENT:  Normocephalic atraumatic.  Sclerae nonicteric, noninjected.  Extraocular movements intact bilaterally. Neck:  Without mass or deformity.  Trachea is midline. Lungs:  Clear to auscultate bilaterally without rhonchi, wheeze, or rales. Heart:  Regular rate and rhythm.  Without murmurs, rubs, or gallops. Abdomen:  Soft, nontender, nondistended.  Without guarding or rebound. Extremities: Without cyanosis, clubbing, edema, or obvious deformity. Vascular:  Dorsalis pedis and posterior tibial pulses palpable bilaterally. Skin:  Warm and dry, no erythema, no ulcerations.   Data Reviewed: I have personally reviewed following labs and imaging studies  CBC: Recent Labs  Lab 11/05/20 0221  WBC 5.3  HGB 10.7*  HCT 33.0*  MCV 99.4  PLT 160   Basic Metabolic Panel: Recent Labs  Lab 11/05/20 0221  NA 140  K 3.4*  CL 106  CO2 27  GLUCOSE 104*  BUN 18  CREATININE 1.00  CALCIUM 8.4*   GFR: Estimated Creatinine Clearance: 59.4 mL/min (by C-G formula based on SCr of 1 mg/dL). Liver Function Tests: Recent Labs  Lab 11/05/20 0221  AST 15  ALT 13  ALKPHOS 70  BILITOT 0.3  PROT 5.9*   ALBUMIN 2.8*   No results for input(s): LIPASE, AMYLASE in the last 168 hours. No results for input(s): AMMONIA in the last 168 hours. Coagulation Profile: No results for input(s): INR, PROTIME in the last 168 hours. Cardiac Enzymes: No results for input(s): CKTOTAL, CKMB, CKMBINDEX, TROPONINI in the last 168 hours. BNP (last 3 results) No results for input(s): PROBNP in the last 8760 hours. HbA1C: No results for input(s): HGBA1C in the last 72 hours. CBG: No results for input(s): GLUCAP in the last 168 hours. Lipid Profile: No results for input(s): CHOL, HDL, LDLCALC, TRIG, CHOLHDL, LDLDIRECT in the last 72 hours. Thyroid Function Tests: No results for input(s): TSH, T4TOTAL, FREET4, T3FREE, THYROIDAB in the last 72 hours. Anemia Panel: No results for input(s): VITAMINB12, FOLATE, FERRITIN, TIBC, IRON, RETICCTPCT in the last 72 hours. Sepsis Labs: No results for input(s): PROCALCITON, LATICACIDVEN in the last 168 hours.  No results found for this or  any previous visit (from the past 240 hour(s)).   Radiology Studies: No results found.  Scheduled Meds: . enoxaparin (LOVENOX) injection  30 mg Subcutaneous Q24H   Continuous Infusions: . lactated ringers 100 mL/hr at 11/05/20 0252  . levETIRAcetam 500 mg (11/05/20 0254)     LOS: 1 day   Time spent: 42min  Hayla Hinger C Ziyon Cedotal, DO [facility]  If 7PM-7AM, please contact night-coverage [email or web address]  11/05/2020, 7:20 AM

## 2020-11-05 NOTE — H&P (Signed)
History and Physical    Benjamin [name] ALP:[id number] DOB: Sep 09, 1965 DOA: 11/04/2020  PCP: Glenda Chroman, MD   Patient coming from: Home via [facility] ER  Chief Complaint: AMS  HPI: Benjamin [name] is a 55 y.o. male with medical history significant for seizure disorder who presented to [facility] emergency room with altered mental status.  His wife reported that he had been acting very erratically.  He was complaining of pain in his left foot.  Reportedly he had an injury to his left foot at home in the last day or 2 and has had pain in the foot since then.  He was found to have fracture of his left first toe.  Placed in a postop shoe in the emergency room.  He had an erratic behavior and was very anxious and uncooperative and he was dosed with Haldol Ativan and Benadryl in the emergency room.  Chest x-ray revealed a lingular pneumonia versus a central lung mass.  CT of his chest was obtained which showed a mass in the left lingula and surrounding postobstructive pneumonitis.  The mass extended into the left hilum and he had left hilar adenopathy.  He also had a mass in the right upper lobe.  MRI was obtained to make sure there is no metastasis that would be causing his symptoms.  MRI did not reveal any intracranial lesions.  There was no interventional radiology, pulmonology, oncology available at [facility] for further work-up so patient was transferred to [facility].  Patient is currently sleepy and not able to provide any history.  Records from [facility] emergency room are reviewed.    Review of Systems:  ROS cannot be assessed due to acute medical condition.  Past Medical History:  Diagnosis Date  . Anxiety   . ED (erectile dysfunction)   . Headache 06/01/2017  . Seizure disorder ([place]) 06/01/2017  . TBI (traumatic brain injury) ([place])    1986    Past Surgical History:  Procedure Laterality Date  . colo-vesicular fistula     repair  . HEMORROIDECTOMY    . reconstructive  surgery     right mandibular    Social History  reports that he has been smoking cigarettes. He has been smoking about 1.00 pack per day. He has never used smokeless tobacco. He reports that he does not drink alcohol and does not use drugs.  Allergies  Allergen Reactions  . Peanut-Containing Drug Products     Diverticulitis     Family History  Problem Relation Age of Onset  . Hypercholesterolemia Mother   . Hypertension Mother   . Hypertension Father   . Heart attack Father   . Seizures Neg Hx      Prior to Admission medications   Medication Sig Start Date End Date Taking? Authorizing Provider  ALPRAZolam Duanne Moron) 0.5 MG tablet Take 0.5 mg by mouth at bedtime as needed for anxiety.    [provider]  amitriptyline (ELAVIL) 25 MG tablet Take 25 mg by mouth at bedtime.    [provider]  ketorolac (TORADOL) 10 MG tablet Take 1 tablet (10 mg total) by mouth every 8 (eight) hours as needed. 05/29/19   Suzzanne Cloud, NP  levETIRAcetam (KEPPRA) 500 MG tablet Take 1 tablet twice daily 11/12/19   Kathrynn Ducking, MD  omeprazole (PRILOSEC) 40 MG capsule Take 40 mg by mouth daily.    [provider]  PHENobarbital (LUMINAL) 64.8 MG tablet Take 2 tablets (129.6 mg total) by  mouth 2 (two) times daily. 01/02/19   Suzzanne Cloud, NP  sildenafil (REVATIO) 20 MG tablet Take 20 mg by mouth 3 (three) times daily.    [provider]  topiramate (TOPAMAX) 100 MG tablet Take 1 tablet (100 mg total) by mouth 2 (two) times daily. 11/12/19   Kathrynn Ducking, MD    Physical Exam: Vitals:   11/04/20 2343  BP: 121/79  Pulse: 80  Resp: 18  Temp: 98.4 F (36.9 C)  TempSrc: Oral  SpO2: 97%  Weight: 51.5 kg  Height: 5\' 7"  (1.702 m)    Constitutional: NAD, calm, comfortable Vitals:   11/04/20 2343  BP: 121/79  Pulse: 80  Resp: 18  Temp: 98.4 F (36.9 C)  TempSrc: Oral  SpO2: 97%  Weight: 51.5 kg  Height: 5\' 7"  (1.702 m)   General: WDWN Eyes: Pupils  equal and reactive to light, conjunctivae normal.  Sclera nonicteric HENT:  [place]/AT, external ears normal.  Nares patent without epistasis.  Mucous membranes are moist.  Neck: Soft, normal passive range of motion, supple, no masses, no thyromegaly. Trachea midline Respiratory: clear to auscultation bilaterally, no wheezing, no crackles. Normal respiratory effort. No accessory muscle use.  Cardiovascular: Regular rate and rhythm, no murmurs / rubs / gallops. No extremity edema. 1+ pedal pulses.  Abdomen: Soft, no tenderness, nondistended. No masses palpated. No hepatosplenomegaly. Bowel sounds normoactive Musculoskeletal: FROM passively. no cyanosis. No joint deformity upper and lower extremities. Normal muscle tone.  Skin: Warm, dry, intact no rashes, lesions, ulcers. No induration Neurologic: patella DTR +1 bilaterally. Babinski downgoing on right.    Labs on Admission: I have personally reviewed following labs and imaging studies  CBC: No results for input(s): WBC, NEUTROABS, HGB, HCT, MCV, PLT in the last 168 hours.  Basic Metabolic Panel: No results for input(s): NA, K, CL, CO2, GLUCOSE, BUN, CREATININE, CALCIUM, MG, PHOS in the last 168 hours.  GFR: CrCl cannot be calculated (Patient's most recent lab result is older than the maximum 21 days allowed.).  Liver Function Tests: No results for input(s): AST, ALT, ALKPHOS, BILITOT, PROT, ALBUMIN in the last 168 hours.  Urine analysis: No results found for: COLORURINE, APPEARANCEUR, LABSPEC, PHURINE, GLUCOSEU, HGBUR, BILIRUBINUR, KETONESUR, PROTEINUR, UROBILINOGEN, NITRITE, LEUKOCYTESUR  Radiological Exams on Admission: No results found.   Assessment/Plan Principal Problem:   Encephalopathy acute Benjamin [name] presented to [facility] emergency room with altered mental status and foot pain.  Patient had encephalopathy and required Haldol, Ativan and Benadryl.  At this time patient is asleep and does not respond to tactile or verbal  stimulation.  Patient is snoring.  Breathing is unlabored. Patient will need EEG in the morning.  He does have a history of seizure disorder. Patient may need psychiatric consult with his altered mental status that he presented to the emergency room with if it has not improved. RI of the brain was obtained at [place] and no lesions were noted.  Active Problems:   Lung neoplasm Patient found to have neoplasms on CT of his chest.  Need oncology consult in the morning.  Need IR or pulmonology to consult for biopsy to definitively diagnose masses    Seizure disorder  Patient takes Keppra twice a day at home.  Will convert to IV Keppra until patient is more alert and to take p.o. medication    Other fracture of left great toe, initial encounter for closed fracture Post op shoe was placed in the emergency room and will be continued.  DVT prophylaxis: Lovenox for DVT prophylaxis. Elevated PADUA score. Code Status:   [name] code  Family Communication:  No family at bedside. Pt with encephalopathy. Disposition Plan:   Patient is from:  Home  Anticipated DC to:  To be determined during hospitalization.  Anticipated DC date:  Anticipate 2 midnight or more stay in the hospital   Admission status:  Inpatient  Yevonne Aline Romen Yutzy MD [facility]  How to contact the [facility] Attending or Consulting provider [place] or covering provider during after hours [place], for this patient?   1. Check the care team in [facility] and look for a) attending/consulting [facility] provider listed and b) the [facility] team listed 2. Log into [email or web address] and use [facility] universal password to access. If you do not have the password, please contact the hospital operator. 3. Locate the [facility] provider you are looking for under [facility] and page to a number that you can be directly reached. 4. If you still have difficulty reaching the provider, please page the [facility] (Director on Call) for the Hospitalists listed  on amion for assistance.  11/05/2020, 2:02 AM

## 2020-11-05 NOTE — Progress Notes (Signed)
Pt attempt to get oob, pull iv lines, not redirectable. Mitts attempted per MD request. Pt kicked at staff and attempted to bite staff and mitts. Communication sent to MD to make aware. Additional interventional request.

## 2020-11-05 NOTE — Procedures (Signed)
Patient Name: Garek Schuneman  MRN: [id number]  Epilepsy Attending: Lora Havens  Referring Physician/Provider: Dr Harrold Donath Date: 11/05/2020 Duration: 22.32 mins  Patient history: 55yo M with h/o seizure presented with AMS. EEG to evaluate for seizure  Level of alertness: Awake, asleep  AEDs during EEG study: LEV  Technical aspects: This EEG study was done with scalp electrodes positioned according to the 10-20 International system of electrode placement. Electrical activity was acquired at a sampling rate of 500Hz  and reviewed with a high frequency filter of 70Hz  and a low frequency filter of 1Hz . EEG data were recorded continuously and digitally stored.   Description: The posterior dominant rhythm consists of 8-9 Hz activity of moderate voltage (25-35 uV) seen predominantly in posterior head regions, symmetric and reactive to eye opening and eye closing. Sleep was characterized by vertex waves, sleep spindles (12 to 14 Hz), maximal frontocentral region.  EEG showed intermittent generalized 3 to 6 Hz theta-delta slowing. Hyperventilation and photic stimulation were not performed.     ABNORMALITY - Intermittent slow, generalized  IMPRESSION: This study is suggestive of mild diffuse encephalopathy, nonspecific etiology. No seizures or epileptiform discharges were seen throughout the recording.  Earsie Humm Barbra Sarks

## 2020-11-06 DIAGNOSIS — R4182 Altered mental status, unspecified: Secondary | ICD-10-CM

## 2020-11-06 MED ORDER — OLANZAPINE 5 MG PO TABS: 5.0000 mg | ORAL_TABLET | Freq: Two times a day (BID) | ORAL | Status: DC

## 2020-11-06 MED ORDER — LORAZEPAM 2 MG/ML IJ SOLN
2.0000 mg | Freq: Four times a day (QID) | INTRAMUSCULAR | Status: DC | PRN
  Administered 2020-11-12: 2 mg via INTRAMUSCULAR
  Filled 2020-11-06 (×2): qty 1

## 2020-11-06 MED ORDER — HALOPERIDOL LACTATE 5 MG/ML IJ SOLN
2.5000 mg | Freq: Four times a day (QID) | INTRAMUSCULAR | Status: DC | PRN
  Administered 2020-11-06 – 2020-11-12 (×12): 2.5 mg via INTRAVENOUS
  Filled 2020-11-06 (×11): qty 1

## 2020-11-06 MED ORDER — HALOPERIDOL LACTATE 5 MG/ML IJ SOLN: 5.0000 mg | Freq: Four times a day (QID) | INTRAMUSCULAR | Status: DC | PRN

## 2020-11-06 MED ORDER — OLANZAPINE 5 MG PO TBDP
5.0000 mg | ORAL_TABLET | Freq: Once | ORAL | Status: DC
  Filled 2020-11-06: qty 1

## 2020-11-06 MED ORDER — OLANZAPINE 5 MG PO TBDP
5.0000 mg | ORAL_TABLET | Freq: Two times a day (BID) | ORAL | Status: DC
  Administered 2020-11-06 – 2020-11-08 (×3): 5 mg via ORAL
  Filled 2020-11-06 (×4): qty 1

## 2020-11-06 MED ORDER — LORAZEPAM 2 MG/ML IJ SOLN
2.0000 mg | Freq: Four times a day (QID) | INTRAMUSCULAR | Status: DC | PRN
  Administered 2020-11-06 – 2020-11-11 (×13): 2 mg via INTRAVENOUS
  Filled 2020-11-06 (×13): qty 1

## 2020-11-06 MED ORDER — OLANZAPINE 5 MG PO TABS
5.0000 mg | ORAL_TABLET | Freq: Once | ORAL | Status: DC
  Filled 2020-11-06: qty 1

## 2020-11-06 MED ORDER — OLANZAPINE 5 MG PO TBDP
5.0000 mg | ORAL_TABLET | Freq: Once | ORAL | Status: AC
  Administered 2020-11-06: 5 mg via ORAL
  Filled 2020-11-06: qty 1

## 2020-11-06 MED ORDER — HALOPERIDOL LACTATE 5 MG/ML IJ SOLN
5.0000 mg | Freq: Four times a day (QID) | INTRAMUSCULAR | Status: DC | PRN
  Administered 2020-11-07: 5 mg via INTRAMUSCULAR
  Filled 2020-11-06 (×2): qty 1

## 2020-11-06 NOTE — Progress Notes (Signed)
Pt biting at safety devices. Pt climbed oob. Staff assistance required. Pt kicking and hitting at staff.

## 2020-11-06 NOTE — Progress Notes (Signed)
Staff called out for assistance,reportspt became aggressive with them and begin biting at objects. Additional staff assist in room. Pt stated he is not supposed to be here.He is not like [place]. He is a white man.

## 2020-11-06 NOTE — Progress Notes (Signed)
MD made aware of pt behaviors including pulling and refusal of iv, refusal of most vital signs, refusal of food. Pt non-compliant with staff and remains not directable. Pt not following commands. Pt inappropriately responds to questions. Pt psychosocial not appropriate for environment/circumstances and remains distant in thought. Pt unsafe at this time.

## 2020-11-06 NOTE — Progress Notes (Signed)
PROGRESS NOTE    Benjamin [name]  DJM:[id number] DOB: 08-08-1965 DOA: 11/04/2020 PCP: Glenda Chroman, MD   Brief Narrative:  Benjamin [name] is a 55 y.o. male with medical history significant for seizure disorder who presented to [facility] emergency room with altered mental status.  His wife reported that he had been acting very erratically.  He was complaining of pain in his left foot.  Reportedly he had an injury to his left foot at home in the last day or 2 and has had pain in the foot since then.  He was found to have fracture of his left first toe.  Placed in a postop shoe in the emergency room.  He had an erratic behavior and was very anxious and uncooperative and he was dosed with Haldol Ativan and Benadryl in the emergency room.  Chest x-ray revealed a lingular pneumonia versus a central lung mass.  CT of his chest was obtained which showed a mass in the left lingula and surrounding postobstructive pneumonitis.  The mass extended into the left hilum and he had left hilar adenopathy.  He also had a mass in the right upper lobe.  MRI was obtained to make sure there is no metastasis that would be causing his symptoms.  MRI did not reveal any intracranial lesions.  There was no interventional radiology, pulmonology, oncology available at [facility] for further work-up so patient was transferred to [facility].   Assessment & Plan:   Principal Problem:   Encephalopathy acute Active Problems:   Seizure disorder ([place])   Lung neoplasm   Other fracture of left great toe, initial encounter for closed fracture   Encephalopathy   Acute versus subacute encephalopathy, unclear etiology, POA Questionable diagnosis of schizophrenia versus schizoaffective disorder -Patient has known history of seizure, indicates he has been noncompliant with medications which may account for some of his mental status changes over the past few days  -MRI at previous facility unremarkable  -EEG unremarkable for  seizure-like activity -Patient also admits to smoking marijuana, denies other illicit substances but may have potential for withdrawal, denies alcohol -Psychiatry consulted, appreciate insight and recommendations given no clear organic etiology for his mental status changes in behavior we do appreciate their assistance with medication changes and further diagnosis. -Patient remains both verbally and physically abusive with staff, attempting to bargain today to get out of restraints as well as continued rapid speech with flight of ideas, discussing himself in third person and questionably speaking to family or people in the room but denies any overt hallucinations/seeing anyone.  Incidental lung nodule - On routine imaging CT of chest showed to have nodules concerning for cancer  -Unfortunately patient currently unwilling or unable to cooperate with further imaging and will hold off given nonurgent nature.  Will likely need outpatient follow-up for further imaging and evaluation for incidental nodule  Seizure disorder, questionable seizure with postictal state, resolved -Patient admits to being on Keppra twice daily with very poor compliance  -Continue IV Keppra until alert and agreeable to take p.o. medications but continues to refuse medications when brought to him  Other fracture of left great toe, initial encounter for closed fracture Post op shoe was placed in the emergency room and will be continued.  No further indication for imaging or intervention at this time.  DVT prophylaxis:  Lovenox Code Status:  Full code  Family Communication: Wife over the phone  Status is: Inpatient  Dispo: The patient is from: Home  Anticipated d/c is to: To be determined              Anticipated d/c date is: 48 to 72 hours              Patient currently not medically stable for discharge  Consultants:   None  Procedures:   None  Antimicrobials:  None  indicated  Subjective: Patient remains somewhat confused this morning, no overt events overnight or this morning but continues to be combative both verbally and physically combative.  Denies nausea vomiting diarrhea constipation headache fevers or chills.  Objective: Vitals:   11/05/20 0559 11/05/20 0739 11/05/20 2157 11/06/20 0523  BP: 103/62 112/79 (!) 147/73 (!) 135/113  Pulse: 71  82 77  Resp: 18 19 18 17   Temp: 98.5 F (36.9 C) 98.5 F (36.9 C) 98.1 F (36.7 C) 97.9 F (36.6 C)  TempSrc:  Oral    SpO2: 97% 97% 99%   Weight:      Height:       No intake or output data in the 24 hours ending 11/06/20 0704 Filed Weights   11/04/20 2343  Weight: 51.5 kg    Examination:  General:  Pleasantly resting in bed, No acute distress.  Cachectic appearing HEENT:  Normocephalic atraumatic.  Sclerae nonicteric, noninjected.  Extraocular movements intact bilaterally. Neck:  Without mass or deformity.  Trachea is midline. Lungs:  Clear to auscultate bilaterally without rhonchi, wheeze, or rales. Heart:  Regular rate and rhythm.  Without murmurs, rubs, or gallops. Abdomen:  Soft, nontender, nondistended.  Without guarding or rebound. Extremities: Without cyanosis, clubbing, edema, or obvious deformity. Vascular:  Dorsalis pedis and posterior tibial pulses palpable bilaterally. Skin:  Warm and dry, no erythema, no ulcerations. Neuro/psych: Alert to person and general situation only, flight of ideas, rapid speech, speaking about himself in third person  Data Reviewed: I have personally reviewed following labs and imaging studies  CBC: Recent Labs  Lab 11/05/20 0221  WBC 5.3  HGB 10.7*  HCT 33.0*  MCV 99.4  PLT 283   Basic Metabolic Panel: Recent Labs  Lab 11/05/20 0221  NA 140  K 3.4*  CL 106  CO2 27  GLUCOSE 104*  BUN 18  CREATININE 1.00  CALCIUM 8.4*   GFR: Estimated Creatinine Clearance: 59.4 mL/min (by C-G formula based on SCr of 1 mg/dL). Liver Function  Tests: Recent Labs  Lab 11/05/20 0221  AST 15  ALT 13  ALKPHOS 70  BILITOT 0.3  PROT 5.9*  ALBUMIN 2.8*   No results for input(s): LIPASE, AMYLASE in the last 168 hours. No results for input(s): AMMONIA in the last 168 hours. Coagulation Profile: No results for input(s): INR, PROTIME in the last 168 hours. Cardiac Enzymes: No results for input(s): CKTOTAL, CKMB, CKMBINDEX, TROPONINI in the last 168 hours. BNP (last 3 results) No results for input(s): PROBNP in the last 8760 hours. HbA1C: No results for input(s): HGBA1C in the last 72 hours. CBG: No results for input(s): GLUCAP in the last 168 hours. Lipid Profile: No results for input(s): CHOL, HDL, LDLCALC, TRIG, CHOLHDL, LDLDIRECT in the last 72 hours. Thyroid Function Tests: No results for input(s): TSH, T4TOTAL, FREET4, T3FREE, THYROIDAB in the last 72 hours. Anemia Panel: No results for input(s): VITAMINB12, FOLATE, FERRITIN, TIBC, IRON, RETICCTPCT in the last 72 hours. Sepsis Labs: No results for input(s): PROCALCITON, LATICACIDVEN in the last 168 hours.  No results found for this or any previous visit (from the past 240 hour(s)).  Radiology Studies: EEG adult  Result Date: 11/06/20 Lora Havens, MD     11-06-2020 11:55 AM Patient Name: Benjamin [name] MRN: [id number] Epilepsy Attending: Lora Havens Referring Physician/Provider: Dr Harrold Donath Date: November 06, 2020 Duration: 22.32 mins Patient history: 55yo M with h/o seizure presented with AMS. EEG to evaluate for seizure Level of alertness: Awake, asleep AEDs during EEG study: LEV Technical aspects: This EEG study was done with scalp electrodes positioned according to the 10-20 International system of electrode placement. Electrical activity was acquired at a sampling rate of 500Hz  and reviewed with a high frequency filter of 70Hz  and a low frequency filter of 1Hz . EEG data were recorded continuously and digitally stored. Description: The posterior dominant rhythm  consists of 8-9 Hz activity of moderate voltage (25-35 uV) seen predominantly in posterior head regions, symmetric and reactive to eye opening and eye closing. Sleep was characterized by vertex waves, sleep spindles (12 to 14 Hz), maximal frontocentral region.  EEG showed intermittent generalized 3 to 6 Hz theta-delta slowing. Hyperventilation and photic stimulation were not performed.   ABNORMALITY - Intermittent slow, generalized IMPRESSION: This study is suggestive of mild diffuse encephalopathy, nonspecific etiology. No seizures or epileptiform discharges were seen throughout the recording. Priyanka Barbra Sarks    Scheduled Meds: . enoxaparin (LOVENOX) injection  40 mg Subcutaneous Q24H   Continuous Infusions: . lactated ringers 100 mL/hr at 06-Nov-2020 1314  . levETIRAcetam 500 mg (11/06/2020 2110)     LOS: 2 days   Time spent: 36min  Mayce Noyes C Arshad Oberholzer, DO [facility]  If 7PM-7AM, please contact night-coverage [email or web address]  11/06/2020, 7:04 AM

## 2020-11-06 NOTE — Progress Notes (Signed)
Pt would not allow ekg tech to do ekg.Patient begin to talk in 3rd person, and begin to kick at staff and hit.Refused iv to be flushed clean and used his head to hit this nurse arm. Pt uses verbally aggressive language/profanity. And saying "Merry Proud and Venetia Night is going back home.He passed away before he met [name] love. He passed away before he was the [place]". Communicated to team on pt behaviors and at this time pt continues to be nonreceptive and exhibits unsafe, aggressive behaviors towards staff.

## 2020-11-06 NOTE — Consult Note (Addendum)
[facility] Face-to-Face Psychiatry Consult   Reason for Consult:  Erratic behavior, AMS Referring Physician:  Holli Humbles, MD Patient Identification: Benjamin [name] MRN:  [id number] Principal Diagnosis: Encephalopathy acute Diagnosis:  Principal Problem:   Encephalopathy acute Active Problems:   Seizure disorder ([place])   Lung neoplasm   Other fracture of left great toe, initial encounter for closed fracture   Encephalopathy   Total Time spent with patient: 30 minutes  Subjective:   Benjamin [name] is a 55 y.o. male patient admitted with medical history significant for seizure disorder who presented to [facility] emergency room with altered mental status . Per EMR patient also has a PPH of schizophrenia vs schizoaffective disorder, it remains unclear if patient had OP Psych. Patient was recently diagnosed with Lung cancer 10/2020.  HPI: Patient was noted to be very agitated, anxious, and unccoperative at [facility] ED and required Haldol, Benadryl, and Ativan. Per EMR handoff noted that patient had been brought by his wife because he was "tearing up the house."    On assessment this AM patient is awake and alert in wrist restraints holding his heart monitor. Patient has a sitter in the room who reports that they put the patient in restraints because he kept tugging at his monitor stickers; however patient appears to still be able to do this. Patient is redirectable but provider had to constantly ask patient not to pull at the strings. Eventually provider gave the patient a roll of tape to keep his hands occupied. Patient repeated himself multiple times claiming "Ulice Dash works here and he has gone home." Patient was able to say that Ulice Dash was his cousin and reported that he worked in a hospital. It was unclear if this was true or not. Patient would also announce the time every 2 minutes as there was a large digital clock in front of his bed.  Patient was able to report that he does have a hx of  seizures and reports that he takes both Keppra and Phenobarbital. Patient denied his hx of Schizophrenia vs Schizoaffective disorder but did say he had been to [employer]. Patient denied seeing a psychiatrist at the moment and denied taking Remeron and Abilify. Patient did report that he takes Xanax but does not know how often he takes it. Patient provided his wife's name, could not remember her number but gave permission to call her. Patient denied that he had been "tearing up the house" before he was taken to the hospital. Patient denied SI and HI NT has not noticed patient hallucinating.  Provider was able to contact patient's PCP practice. Per staff at [facility] they have not been treating patient for schizophrenia but do have PMH including that patient has anxiety and his prescription list included Xanax. Patient had last been seen 07/24/2020.   Received collateral from patient's wife, Benjamin [name] the only number to reach her is [phone]). Benjamin [name] reports I caught her on her break time and she will likely not be available again until Monday as she is working this weekend. Benjamin [name] reports that the patient has been very paranoid and acting bizarre. Benjamin [name] reports that the patient has been very paranoid and was very paranoid prior to his admission to [facility]. Patient was worried that he was being tracked through his wife's cellphone and flushed it down the toilet. Patient has also been accusing wife of cheating on him and was also hiding around the home. Wife reports that the patient was having AVH and appeared to be  responding to internal stimuli. Patient stopped taking his medications prescribed at [facility] 2 days prior to her having to take him to [facility] for this current admission. Wife reports that in his bizarre behavior he dropped an 80lb stereo on his foot which was later determined to have a fracture. Wife reports she is afraid of the patient right now.   Wife reports that  patient had no psych hx prior to 09/2020. His fisrt psychotic break was in 09/2020 and led to him being hospitalized and his second was 10/2020. Wife reports that he has never gone nights with sleep. She does report that patient smoked THC daily from age 76 y until 3 mon ago. Patient apparently stopped smoking after he became paranoid about 3 mon ago. Wife does report that patient has a brother with schizophrenia.  Past Psychiatric History: Schizophrenia vs schizoaffective disorder  Risk to Self:   NO Risk to Others:  NO Prior Inpatient Therapy:   Yes [facility] 5/18-5/23 Of note patient was transferred out of [facility] as he appeared to be more agitated  (requiring Geodon) than usual but also appeared more quiet. Patient was transferred to a medical hospital where he was found to have Lung Cancer 5/23.  Per EMR patient appears to have been treated with Remeron and Abilify in the past.  Prior Outpatient Therapy:   UNknown  Past Medical History:  Past Medical History:  Diagnosis Date  . Anxiety   . ED (erectile dysfunction)   . Headache 06/01/2017  . Seizure disorder ([place]) 06/01/2017  . TBI (traumatic brain injury) ([place])    1986    Past Surgical History:  Procedure Laterality Date  . colo-vesicular fistula     repair  . HEMORROIDECTOMY    . reconstructive surgery     right mandibular   Family History:  Family History  Problem Relation Age of Onset  . Hypercholesterolemia Mother   . Hypertension Mother   . Hypertension Father   . Heart attack Father   . Seizures Neg Hx    Family Psychiatric  History: Unknown Social History:  Social History   Substance and Sexual Activity  Alcohol Use No     Social History   Substance and Sexual Activity  Drug Use No    Social History   Socioeconomic History  . Marital status: Married    Spouse name: Not on file  . Number of children: Not on file  . Years of education: Not on file  . Highest education level: Not on file   Occupational History  . Not on file  Tobacco Use  . Smoking status: Current Every Day Smoker    Packs/day: 1.00    Types: Cigarettes  . Smokeless tobacco: Never Used  Substance and Sexual Activity  . Alcohol use: No  . Drug use: No  . Sexual activity: Not on file  Other Topics Concern  . Not on file  Social History Narrative  . Not on file   Social Determinants of Health   Financial Resource Strain: Not on file  Food Insecurity: Not on file  Transportation Needs: Not on file  Physical Activity: Not on file  Stress: Not on file  Social Connections: Not on file   Additional Social History:    Allergies:   Allergies  Allergen Reactions  . Peanut-Containing Drug Products     Diverticulitis     Labs:  Results for orders placed or performed during the hospital encounter of 11/04/20 (from the  past 48 hour(s))  HIV Antibody (routine testing w rflx)     Status: None   Collection Time: 11/05/20  2:21 AM  Result Value Ref Range   HIV Screen 4th Generation wRfx Non Reactive Non Reactive    Comment: Performed at [facility], [place] [address] [place], [place] [address]  Comprehensive metabolic panel     Status: Abnormal   Collection Time: 11/05/20  2:21 AM  Result Value Ref Range   Sodium 140 135 - 145 mmol/L   Potassium 3.4 (L) 3.5 - 5.1 mmol/L   Chloride 106 98 - 111 mmol/L   CO2 27 22 - 32 mmol/L   Glucose, Bld 104 (H) 70 - 99 mg/dL    Comment: Glucose reference range applies only to samples taken after fasting for at least 8 hours.   BUN 18 6 - 20 mg/dL   Creatinine, Ser 1.00 0.61 - 1.24 mg/dL   Calcium 8.4 (L) 8.9 - 10.3 mg/dL   Total Protein 5.9 (L) 6.5 - 8.1 g/dL   Albumin 2.8 (L) 3.5 - 5.0 g/dL   AST 15 15 - 41 U/L   ALT 13 0 - 44 U/L   Alkaline Phosphatase 70 38 - 126 U/L   Total Bilirubin 0.3 0.3 - 1.2 mg/dL   GFR, Estimated >60 >60 mL/min    Comment: (NOTE) Calculated using the CKD-EPI Creatinine Equation (2021)    Anion gap 7 5 - 15    Comment:  Performed at [facility], [place] [address], [place], [place] [address]  CBC     Status: Abnormal   Collection Time: 11/05/20  2:21 AM  Result Value Ref Range   WBC 5.3 4.0 - 10.5 K/uL   RBC 3.32 (L) 4.22 - 5.81 MIL/uL   Hemoglobin 10.7 (L) 13.0 - 17.0 g/dL   HCT 33.0 (L) 39.0 - 52.0 %   MCV 99.4 80.0 - 100.0 fL   MCH 32.2 26.0 - 34.0 pg   MCHC 32.4 30.0 - 36.0 g/dL   RDW 11.9 11.5 - 15.5 %   Platelets 248 150 - 400 K/uL   nRBC 0.0 0.0 - 0.2 %    Comment: Performed at [facility], [place] [address] [place], [place] [address]    Current Facility-Administered Medications  Medication Dose Route Frequency Provider Last Rate Last Admin  . acetaminophen (TYLENOL) tablet 650 mg  650 mg Oral Q6H PRN Chotiner, Yevonne Aline, MD       Or  . acetaminophen (TYLENOL) suppository 650 mg  650 mg Rectal Q6H PRN Chotiner, Yevonne Aline, MD      . enoxaparin (LOVENOX) injection 40 mg  40 mg Subcutaneous Q24H Karren Cobble, RPH   40 mg at 11/05/20 1000  . lactated ringers infusion   Intravenous Continuous Chotiner, Yevonne Aline, MD 100 mL/hr at 11/05/20 1314 New Bag at 11/05/20 1314  . levETIRAcetam (KEPPRA) IVPB 500 mg/100 mL premix  500 mg Intravenous Q12H Chotiner, Yevonne Aline, MD 400 mL/hr at 11/06/20 1211 500 mg at 11/06/20 1211  . [START ON 11/07/2020] OLANZapine (ZYPREXA) tablet 5 mg  5 mg Oral BID Damita Dunnings B, MD      . OLANZapine (ZYPREXA) tablet 5 mg  5 mg Oral Once Damita Dunnings B, MD      . OLANZapine (ZYPREXA) tablet 5 mg  5 mg Oral Once Damita Dunnings B, MD      . ondansetron (ZOFRAN) tablet 4 mg  4 mg Oral Q6H PRN Chotiner,  Yevonne Aline, MD       Or  . ondansetron [facility]) injection 4 mg  4 mg Intravenous Q6H PRN Chotiner, Yevonne Aline, MD        Musculoskeletal: Strength & Muscle Tone: within normal limits Gait & Station: deferred patient remains in bed Patient leans: N/A            Psychiatric Specialty Exam:  Presentation  General Appearance: Appropriate for  Environment  Eye Contact:Minimal  Speech:Slow  Speech Volume:Decreased  Handedness:No data recorded  Mood and Affect  Mood:Euthymic  Affect:Flat   Thought Process  Thought Processes:Irrevelant  Descriptions of Associations:Circumstantial  Orientation:-- (only oriented to person and place. constantly tells the time of day but does not know the year or month or day of the week nor does he understand why he is in the hospital)  Thought Content:Perseveration  History of Schizophrenia/Schizoaffective disorder:No data recorded Duration of Psychotic Symptoms:No data recorded Hallucinations:Hallucinations: None (today per staff)  Ideas of Reference:Delusions (possible delusion unable to cofirm if he really has a cousin named Ulice Dash who works in the hospital)  Suicidal Thoughts:Suicidal Thoughts: No  Homicidal Thoughts:Homicidal Thoughts: No   Sensorium  Memory:Immediate Poor; Recent Poor; Remote Fair (is able to go through his med list and can say what he takes)  [place]  Insight:None   Executive Functions  Concentration:Poor  Attention Span:Poor  [place] of Knowledge:Poor  Language:Poor   Psychomotor Activity  Psychomotor Activity:Psychomotor Activity: Decreased   Assets  Assets:Desire for Improvement; Housing; Resilience   Sleep  Sleep:Sleep: Poor   Physical Exam: Physical Exam Constitutional:      Comments: Looks confused  HENT:     Head: Normocephalic and atraumatic.  Eyes:     Extraocular Movements: Extraocular movements intact.     Conjunctiva/sclera: Conjunctivae normal.  Cardiovascular:     Rate and Rhythm: Normal rate.  Pulmonary:     Effort: Pulmonary effort is normal.     Breath sounds: Normal breath sounds.  Abdominal:     General: Abdomen is flat.  Musculoskeletal:        General: Normal range of motion.  Skin:    General: Skin is warm and dry.  Neurological:     Mental Status: He is alert. He is  disoriented.    Review of Systems  Constitutional: Negative for chills and fever.  HENT: Negative for hearing loss.   Eyes: Negative for blurred vision.  Respiratory: Negative for cough and wheezing.   Cardiovascular: Negative for chest pain.  Gastrointestinal: Negative for abdominal pain.  Neurological: Negative for dizziness.  Psychiatric/Behavioral: Negative for suicidal ideas.   Blood pressure (!) 151/92, pulse 77, temperature 98.2 F (36.8 C), temperature source Axillary, resp. rate 16, height 5\' 7"  (1.702 m), weight 51.5 kg, SpO2 99 %. Body mass index is 17.78 kg/m.  Treatment Plan Summary: Daily contact with patient to assess and evaluate symptoms and progress in treatment   Hx of schizophrenia vs schizoaffective disorder Patient appears delusional at this time with current symptoms including disorganized behavior including repeating the same phrase over and over again, word clanging, and interjecting with the current time.  At this time it is unclear if patient was taking medication for his schizophrenia or if he was seeing psychiatry outpatient; however, at this time it appears unlikely. Patient will benefit from antipsychotic therapy.   - Will start Zyprexa Zydis 5mg  BID - Haldol 5mg  IM or 2.5mg  IV and Ativan 2 mg IM or IV for agitation protocol  Recommendations - EKG, patient's EMR reports that patient is regularly taking Elavil which can induce prolonged QTc - Continue sitter as primary team deems necessary - Trial without wrist restraints once patient receives Zyprexa  Disposition: Psychiatry will continue to follow  PGY-1 Freida Busman, MD 11/06/2020 12:21 PM

## 2020-11-07 DIAGNOSIS — G9340 Encephalopathy, unspecified: Secondary | ICD-10-CM

## 2020-11-07 DIAGNOSIS — G4089 Other seizures: Secondary | ICD-10-CM

## 2020-11-07 LAB — COMPREHENSIVE METABOLIC PANEL
ALT: 19 U/L (ref 0–44)
AST: 20 U/L (ref 15–41)
Albumin: 3 g/dL — ABNORMAL LOW (ref 3.5–5.0)
Alkaline Phosphatase: 84 U/L (ref 38–126)
Anion gap: 12 (ref 5–15)
BUN: 12 mg/dL (ref 6–20)
CO2: 25 mmol/L (ref 22–32)
Calcium: 9.1 mg/dL (ref 8.9–10.3)
Chloride: 102 mmol/L (ref 98–111)
Creatinine, Ser: 0.78 mg/dL (ref 0.61–1.24)
GFR, Estimated: >60 mL/min (ref >60)
Glucose, Bld: 114 mg/dL — ABNORMAL HIGH (ref 70–99)
Potassium: 3.4 mmol/L — ABNORMAL LOW (ref 3.5–5.1)
Sodium: 139 mmol/L (ref 135–145)
Total Bilirubin: 0.7 mg/dL (ref 0.3–1.2)
Total Protein: 6.6 g/dL (ref 6.5–8.1)

## 2020-11-07 LAB — CBC
HCT: 35.9 % — ABNORMAL LOW (ref 39.0–52.0)
Hemoglobin: 12.1 g/dL — ABNORMAL LOW (ref 13.0–17.0)
MCH: 32.9 pg (ref 26.0–34.0)
MCHC: 33.7 g/dL (ref 30.0–36.0)
MCV: 97.6 fL (ref 80.0–100.0)
Platelets: 270 10*3/uL (ref 150–400)
RBC: 3.68 MIL/uL — ABNORMAL LOW (ref 4.22–5.81)
RDW: 11.6 % (ref 11.5–15.5)
WBC: 7 10*3/uL (ref 4.0–10.5)
nRBC: 0 % (ref 0.0–0.2)

## 2020-11-07 NOTE — Progress Notes (Signed)
Pt requested tylenol when this nurse brought in pt refused meds. Pt then attempt to get oob and pull iv site out. Pt getting aggressive with staff.

## 2020-11-07 NOTE — Progress Notes (Signed)
Pt requested a cheeseburger. MD ok diet change.

## 2020-11-07 NOTE — Consult Note (Addendum)
[facility] Face-to-Face Psychiatry Consult   Reason for Consult:  Erratic behavior, AMS Referring Physician:  Holli Humbles, MD Patient Identification: Benjamin [name] MRN:  [id number] Principal Diagnosis: Encephalopathy acute Diagnosis:  Principal Problem:   Encephalopathy acute Active Problems:   Seizure disorder ([place])   Lung neoplasm   Other fracture of left great toe, initial encounter for closed fracture   Encephalopathy   Total Time spent with patient: 30 minutes  Subjective:   Benjamin [name] is a 55 y.o. male patient admitted with medical history significant for seizure disorder who presented to [facility] emergency room with altered mental status . Per EMR patient also has a PPH of schizophrenia vs schizoaffective disorder, it remains unclear if patient had OP Psych. Patient was recently diagnosed with Lung cancer 10/2020.  HPI: Patient was noted to be very agitated, anxious, and unccoperative at [facility] ED and required Haldol, Benadryl, and Ativan. Per EMR handoff noted that patient had been brought by his wife because he was "tearing up the house."    On assessment this AM patient is awake and alert in wrist restraints speaking to Dr. Avon Gully. He is oriented to self, city, hospital, year. He states that the month is "March" and does not know the reason for being in the hospital ("my lungs"). Per chart review, patient was agitated and received 2.5 mg  Haldol IV and 2 mg IV ativan in the last 24 hours and has been compliant with scheduled zyprexa. Per documentation patient appears to be doing much better and is Benjamin to hold a conversation. He does not recall getting agitated yesterday and requests to be let out of soft restraints and to walk around. He denies SI/HI/AVH. He does not appear to be objectively manic or psychotic and appears much less confused.       Past Psychiatric History: Schizophrenia vs schizoaffective disorder Wife reports that patient had no psych hx prior  to 09/2020. His fisrt psychotic break was in 09/2020 and led to him being hospitalized and his second was 10/2020. Wife reports that he has never gone nights with sleep. She does report that patient smoked THC daily from age 78 y until 3 mon ago. Patient apparently stopped smoking after he became paranoid about 3 mon ago. Wife does report that patient has a brother with schizophrenia.   Risk to Self:   NO Risk to Others:  NO Prior Inpatient Therapy:   Yes [facility] 5/18-5/23 Of note patient was transferred out of [facility] as he appeared to be more agitated  (requiring Geodon) than usual but also appeared more quiet. Patient was transferred to a medical hospital where he was found to have Lung Cancer 5/23.  Per EMR patient appears to have been treated with Remeron and Abilify in the past.  Prior Outpatient Therapy:   UNknown  Past Medical History:  Past Medical History:  Diagnosis Date  . Anxiety   . ED (erectile dysfunction)   . Headache 06/01/2017  . Seizure disorder ([place]) 06/01/2017  . TBI (traumatic brain injury) ([place])    1986    Past Surgical History:  Procedure Laterality Date  . colo-vesicular fistula     repair  . HEMORROIDECTOMY    . reconstructive surgery     right mandibular   Family History:  Family History  Problem Relation Age of Onset  . Hypercholesterolemia Mother   . Hypertension Mother   . Hypertension Father   . Heart attack Father   . Seizures Neg Hx  Family Psychiatric  History: Unknown Social History:  Social History   Substance and Sexual Activity  Alcohol Use No     Social History   Substance and Sexual Activity  Drug Use No    Social History   Socioeconomic History  . Marital status: Married    Spouse name: Not on file  . Number of children: Not on file  . Years of education: Not on file  . Highest education level: Not on file  Occupational History  . Not on file  Tobacco Use  . Smoking status: Current Every Day Smoker     Packs/day: 1.00    Types: Cigarettes  . Smokeless tobacco: Never Used  Substance and Sexual Activity  . Alcohol use: No  . Drug use: No  . Sexual activity: Not on file  Other Topics Concern  . Not on file  Social History Narrative  . Not on file   Social Determinants of Health   Financial Resource Strain: Not on file  Food Insecurity: Not on file  Transportation Needs: Not on file  Physical Activity: Not on file  Stress: Not on file  Social Connections: Not on file   Additional Social History:    Allergies:   Allergies  Allergen Reactions  . Peanut-Containing Drug Products     Diverticulitis     Labs:  Results for orders placed or performed during the hospital encounter of 11/04/20 (from the past 48 hour(s))  CBC     Status: Abnormal   Collection Time: 11/07/20  2:45 AM  Result Value Ref Range   WBC 7.0 4.0 - 10.5 K/uL   RBC 3.68 (L) 4.22 - 5.81 MIL/uL   Hemoglobin 12.1 (L) 13.0 - 17.0 g/dL   HCT 35.9 (L) 39.0 - 52.0 %   MCV 97.6 80.0 - 100.0 fL   MCH 32.9 26.0 - 34.0 pg   MCHC 33.7 30.0 - 36.0 g/dL   RDW 11.6 11.5 - 15.5 %   Platelets 270 150 - 400 K/uL   nRBC 0.0 0.0 - 0.2 %    Comment: Performed at [facility], [place] [address], [place], [place] [address]  Comprehensive metabolic panel     Status: Abnormal   Collection Time: 11/07/20  2:45 AM  Result Value Ref Range   Sodium 139 135 - 145 mmol/L   Potassium 3.4 (L) 3.5 - 5.1 mmol/L   Chloride 102 98 - 111 mmol/L   CO2 25 22 - 32 mmol/L   Glucose, Bld 114 (H) 70 - 99 mg/dL    Comment: Glucose reference range applies only to samples taken after fasting for at least 8 hours.   BUN 12 6 - 20 mg/dL   Creatinine, Ser 0.78 0.61 - 1.24 mg/dL   Calcium 9.1 8.9 - 10.3 mg/dL   Total Protein 6.6 6.5 - 8.1 g/dL   Albumin 3.0 (L) 3.5 - 5.0 g/dL   AST 20 15 - 41 U/L   ALT 19 0 - 44 U/L   Alkaline Phosphatase 84 38 - 126 U/L   Total Bilirubin 0.7 0.3 - 1.2 mg/dL   GFR, Estimated >60 >60 mL/min    Comment:  (NOTE) Calculated using the CKD-EPI Creatinine Equation (2021)    Anion gap 12 5 - 15    Comment: Performed at [facility] [address], [place], [place] [address]    Current Facility-Administered Medications  Medication Dose Route Frequency Provider Last Rate Last Admin  . acetaminophen (TYLENOL) tablet 650  mg  650 mg Oral Q6H PRN Chotiner, Yevonne Aline, MD       Or  . acetaminophen (TYLENOL) suppository 650 mg  650 mg Rectal Q6H PRN Chotiner, Yevonne Aline, MD      . enoxaparin (LOVENOX) injection 40 mg  40 mg Subcutaneous Q24H Karren Cobble, RPH   40 mg at 11/07/20 0919  . haloperidol lactate (HALDOL) injection 2.5 mg  2.5 mg Intravenous Q6H PRN Damita Dunnings B, MD   2.5 mg at 11/06/20 1447   Or  . haloperidol lactate (HALDOL) injection 5 mg  5 mg Intramuscular Q6H PRN Damita Dunnings B, MD      . lactated ringers infusion   Intravenous Continuous Chotiner, Yevonne Aline, MD 100 mL/hr at 11/07/20 0224 New Bag at 11/07/20 0224  . levETIRAcetam (KEPPRA) IVPB 500 mg/100 mL premix  500 mg Intravenous Q12H Chotiner, Yevonne Aline, MD 400 mL/hr at 11/07/20 0917 500 mg at 11/07/20 0917  . LORazepam (ATIVAN) injection 2 mg  2 mg Intravenous Q6H PRN Damita Dunnings B, MD   2 mg at 11/06/20 1804   Or  . LORazepam (ATIVAN) injection 2 mg  2 mg Intramuscular Q6H PRN Damita Dunnings B, MD      . OLANZapine zydis (ZYPREXA) disintegrating tablet 5 mg  5 mg Oral BID Damita Dunnings B, MD   5 mg at 11/07/20 0714  . ondansetron (ZOFRAN) tablet 4 mg  4 mg Oral Q6H PRN Chotiner, Yevonne Aline, MD       Or  . ondansetron (ZOFRAN) injection 4 mg  4 mg Intravenous Q6H PRN Chotiner, Yevonne Aline, MD        Musculoskeletal: Strength & Muscle Tone: within normal limits Gait & Station: deferred patient remains in bed Patient leans: N/A            Psychiatric Specialty Exam:  Presentation  General Appearance: Appropriate for Environment; Casual  Eye Contact:Fair  Speech:Clear and Coherent; Normal  Rate  Speech Volume:Decreased  Handedness:No data recorded  Mood and Affect  Mood:Euthymic  Affect:Constricted   Thought Process  Thought Processes:Coherent  Descriptions of Associations:Intact  Orientation:-- (oriented to hospital, [place], year. Does not know month or reason for hosiptal presentation)  Thought Content:Logical  History of Schizophrenia/Schizoaffective disorder:No data recorded Duration of Psychotic Symptoms:No data recorded Hallucinations:Hallucinations: None  Ideas of Reference:None  Suicidal Thoughts:Suicidal Thoughts: No  Homicidal Thoughts:Homicidal Thoughts: No   Sensorium  Memory:Immediate Fair; Recent Poor; Remote Poor  Judgment:Other (comment) (improving)  Insight:Other (comment); Shallow (impeoving)   Executive Functions  Concentration:Poor  Attention Span:Fair  Recall:Poor  Fund of Knowledge:Fair  Language:Fair   Psychomotor Activity  Psychomotor Activity:Psychomotor Activity: Decreased   Assets  Assets:Communication Skills; Desire for Improvement; Resilience   Sleep  Sleep:Sleep: Fair   Physical Exam: Physical Exam Constitutional:      Comments: Looks confused  HENT:     Head: Normocephalic and atraumatic.  Eyes:     Extraocular Movements: Extraocular movements intact.     Conjunctiva/sclera: Conjunctivae normal.  Pulmonary:     Effort: Pulmonary effort is normal.  Skin:    General: Skin is warm and dry.  Neurological:     Mental Status: He is alert. He is disoriented.    Review of Systems  Constitutional: Negative for chills and fever.  HENT: Negative for hearing loss.   Eyes: Negative for redness.  Respiratory: Negative for cough.   Cardiovascular: Negative for chest pain.  Gastrointestinal: Negative for abdominal pain.  Musculoskeletal: Negative for myalgias.  Neurological:  Negative for seizures and weakness.  Psychiatric/Behavioral: Positive for hallucinations. Negative for depression and  suicidal ideas.   Blood pressure (!) 151/78, pulse 79, temperature 97.9 F (36.6 C), temperature source Axillary, resp. rate 18, height 5\' 7"  (1.702 m), weight 51.5 kg, SpO2 98 %. Body mass index is 17.78 kg/m.  Treatment Plan Summary: Daily contact with patient to assess and evaluate symptoms and progress in treatment   Hx of schizophrenia vs schizoaffective disorder Delirium Wife reports that patient had no psych hx prior to 09/2020. His fisrt psychotic break was in 09/2020 and led to him being hospitalized and his second was 10/2020. Wife reports that he has never gone nights with sleep. She does report that patient smoked THC daily from age 89 y until 3 mon ago. Patient apparently stopped smoking after he became paranoid about 3 mon ago. Wife does report that patient has a brother with schizophrenia. It is uncommon for individuals to be diagnosed with schizophrenia spectrum disorders for the first time in their later 50s; unclear veracity of diagnosis as could be related to possible delirium that has been long standing in the setting of lung neoplasm and possible contributing effects of marijuana use.  - Will continue Zyprexa Zydis 5mg  BID - Haldol 5mg  IM or 2.5mg  IV and Ativan 2 mg IM or IV for agitation protocol   Recommendations - EKG, patient's EMR reports that patient is regularly taking Elavil which can induce prolonged QTc - Continue sitter as primary team deems necessary - Trial without wrist restraints as patient appears to be more calm  Disposition: Psychiatry will continue to follow . Does not meet criteria for inpatient psychiatric admission at this time based on today's evaluation.   Discussed recommendations in person with Dr. Adline Mango, MD  Attending psychiatrist 11/07/2020 12:10 PM

## 2020-11-07 NOTE — Progress Notes (Signed)
PROGRESS NOTE    Benjamin [name]  VZC:[id number] DOB: [birth year] DOA: 11/04/2020 PCP: Glenda Chroman, MD   Brief Narrative:  Benjamin [name] is a 55 y.o. male with medical history significant for seizure disorder who presented to [facility] emergency room with altered mental status.  His wife reported that he had been acting very erratically.  He was complaining of pain in his left foot.  Reportedly he had an injury to his left foot at home in the last day or 2 and has had pain in the foot since then.  He was found to have fracture of his left first toe.  Placed in a postop shoe in the emergency room.  He had an erratic behavior and was very anxious and uncooperative and he was dosed with Haldol Ativan and Benadryl in the emergency room.  Chest x-ray revealed a lingular pneumonia versus a central lung mass.  CT of his chest was obtained which showed a mass in the left lingula and surrounding postobstructive pneumonitis.  The mass extended into the left hilum and he had left hilar adenopathy.  He also had a mass in the right upper lobe.  MRI was obtained to make sure there is no metastasis that would be causing his symptoms.  MRI did not reveal any intracranial lesions.  There was no interventional radiology, pulmonology, oncology available at [facility] for further work-up so patient was transferred to [facility].   Assessment & Plan:   Principal Problem:   Encephalopathy acute Active Problems:   Seizure disorder ([place])   Lung neoplasm   Other fracture of left great toe, initial encounter for closed fracture   Encephalopathy  Acute versus subacute encephalopathy, unclear etiology, POA Questionable diagnosis of schizophrenia versus schizoaffective disorder -Patient has known history of seizure, indicates he has been noncompliant with medications which may account for some of his mental status changes over the past few days but has been having behavioral issues worsening generally since April  which would be less likely to be caused simply by postictal state -MRI at previous facility unremarkable  -EEG unremarkable for seizure-like activity -Patient also admits to smoking marijuana, denies other illicit substances but may have potential for withdrawal, denies alcohol -Psychiatry consulted, appreciate insight and recommendations given no clear organic etiology for his mental status changes in behavior we do appreciate their assistance with medication changes and further diagnosis. -Combative again overnight requiring further restraints as well as sedation, will increase Ativan and Haldol as necessary, again appreciate psychiatry's expertise in this area, patient today states he will be compliant with p.o. medications which may open more avenues for treatment as he has previously been refusing p.o. medications.  Incidental lung nodule - On routine imaging CT of chest showed to have nodules concerning for cancer  -Unfortunately patient currently unwilling or unable to cooperate with further imaging and will hold off given nonurgent nature.  Will likely need outpatient follow-up for further imaging and evaluation for incidental nodule  Seizure disorder -Cannot rule out acute breakthrough seizure with postictal state due to noncompliance - patient is well outside the window for postictal state at this point -Patient admits to being on Keppra and phenobarbital with poor compliance  -Continue IV antiepileptics until patient is more compliant with p.o. intake  Other fracture of left great toe, initial encounter for closed fracture Post op shoe was placed in the emergency room and will be continued.  No further indication for imaging or intervention at this time.  DVT prophylaxis:  Lovenox Code Status:  Full code  Family Communication: Wife over the phone  Status is: Inpatient  Dispo: The patient is from: Home              Anticipated d/c is to: To be determined               Anticipated d/c date is: 48 to 72 hours              Patient currently not medically stable for discharge  Consultants:   None  Procedures:   None  Antimicrobials:  None indicated  Subjective: Acutely overnight patient was again combative requiring increased sedation of Ativan and Haldol.  Somewhat more calm this morning more reasonable responses during our conversation, continues to attempt to barter to get out of restraints but seems to be more compliant with interview.  Denies chest pain shortness of breath nausea or vomiting.  Objective: Vitals:   11/06/20 0523 11/06/20 0930 11/06/20 2010 11/07/20 0630  BP: (!) 135/113 (!) 151/92 126/87 135/79  Pulse: 77  95 81  Resp: 17 16 19 16   Temp: 97.9 F (36.6 C) 98.2 F (36.8 C) 97.8 F (36.6 C) 98 F (36.7 C)  TempSrc:  Axillary Oral   SpO2:   98% 92%  Weight:      Height:        Intake/Output Summary (Last 24 hours) at 11/07/2020 0711 Last data filed at 11/07/2020 6301 Gross per 24 hour  Intake 290 ml  Output 1750 ml  Net -1460 ml   Filed Weights   11/04/20 2343  Weight: 51.5 kg    Examination:  General:  Pleasantly resting in bed, No acute distress.  Cachectic appearing HEENT:  Normocephalic atraumatic.  Sclerae nonicteric, noninjected.  Extraocular movements intact bilaterally. Neck:  Without mass or deformity.  Trachea is midline. Lungs:  Clear to auscultate bilaterally without rhonchi, wheeze, or rales. Heart:  Regular rate and rhythm.  Without murmurs, rubs, or gallops. Abdomen:  Soft, nontender, nondistended.  Without guarding or rebound. Extremities: Without cyanosis, clubbing, edema, or obvious deformity. Vascular:  Dorsalis pedis and posterior tibial pulses palpable bilaterally. Skin:  Warm and dry, no erythema, no ulcerations. Neuro/psych: Alert to person and general situation only, flight of ideas, rapid speech  Data Reviewed: I have personally reviewed following labs and imaging studies  CBC: Recent  Labs  Lab 11/05/20 0221 11/07/20 0245  WBC 5.3 7.0  HGB 10.7* 12.1*  HCT 33.0* 35.9*  MCV 99.4 97.6  PLT 248 601   Basic Metabolic Panel: Recent Labs  Lab 11/05/20 0221 11/07/20 0245  NA 140 139  K 3.4* 3.4*  CL 106 102  CO2 27 25  GLUCOSE 104* 114*  BUN 18 12  CREATININE 1.00 0.78  CALCIUM 8.4* 9.1   GFR: Estimated Creatinine Clearance: 74.2 mL/min (by C-G formula based on SCr of 0.78 mg/dL). Liver Function Tests: Recent Labs  Lab 11/05/20 0221 11/07/20 0245  AST 15 20  ALT 13 19  ALKPHOS 70 84  BILITOT 0.3 0.7  PROT 5.9* 6.6  ALBUMIN 2.8* 3.0*   No results for input(s): LIPASE, AMYLASE in the last 168 hours. No results for input(s): AMMONIA in the last 168 hours. Coagulation Profile: No results for input(s): INR, PROTIME in the last 168 hours. Cardiac Enzymes: No results for input(s): CKTOTAL, CKMB, CKMBINDEX, TROPONINI in the last 168 hours. BNP (last 3 results) No results for input(s): PROBNP in the last 8760 hours. HbA1C: No results for input(s): HGBA1C  in the last 72 hours. CBG: No results for input(s): GLUCAP in the last 168 hours. Lipid Profile: No results for input(s): CHOL, HDL, LDLCALC, TRIG, CHOLHDL, LDLDIRECT in the last 72 hours. Thyroid Function Tests: No results for input(s): TSH, T4TOTAL, FREET4, T3FREE, THYROIDAB in the last 72 hours. Anemia Panel: No results for input(s): VITAMINB12, FOLATE, FERRITIN, TIBC, IRON, RETICCTPCT in the last 72 hours. Sepsis Labs: No results for input(s): PROCALCITON, LATICACIDVEN in the last 168 hours.  No results found for this or any previous visit (from the past 240 hour(s)).   Radiology Studies: EEG adult  Result Date: 11-16-2020 Lora Havens, MD     Nov 16, 2020 11:55 AM Patient Name: Ade Stmarie MRN: [id number] Epilepsy Attending: Lora Havens Referring Physician/Provider: Dr Harrold Donath Date: 11-16-2020 Duration: 22.32 mins Patient history: 55yo M with h/o seizure presented with AMS. EEG to  evaluate for seizure Level of alertness: Awake, asleep AEDs during EEG study: LEV Technical aspects: This EEG study was done with scalp electrodes positioned according to the 10-20 International system of electrode placement. Electrical activity was acquired at a sampling rate of 500Hz  and reviewed with a high frequency filter of 70Hz  and a low frequency filter of 1Hz . EEG data were recorded continuously and digitally stored. Description: The posterior dominant rhythm consists of 8-9 Hz activity of moderate voltage (25-35 uV) seen predominantly in posterior head regions, symmetric and reactive to eye opening and eye closing. Sleep was characterized by vertex waves, sleep spindles (12 to 14 Hz), maximal frontocentral region.  EEG showed intermittent generalized 3 to 6 Hz theta-delta slowing. Hyperventilation and photic stimulation were not performed.   ABNORMALITY - Intermittent slow, generalized IMPRESSION: This study is suggestive of mild diffuse encephalopathy, nonspecific etiology. No seizures or epileptiform discharges were seen throughout the recording. Priyanka Barbra Sarks    Scheduled Meds: . enoxaparin (LOVENOX) injection  40 mg Subcutaneous Q24H  . OLANZapine zydis  5 mg Oral BID   Continuous Infusions: . lactated ringers 100 mL/hr at 11/07/20 0224  . levETIRAcetam 500 mg (11/06/20 2016)     LOS: 3 days   Time spent: 69min  Aziyah Provencal C Adebayo Ensminger, DO [facility]  If 7PM-7AM, please contact night-coverage [email or web address]  11/07/2020, 7:11 AM

## 2020-11-08 LAB — COMPREHENSIVE METABOLIC PANEL
ALT: 52 U/L — ABNORMAL HIGH (ref 0–44)
AST: 79 U/L — ABNORMAL HIGH (ref 15–41)
Albumin: 2.7 g/dL — ABNORMAL LOW (ref 3.5–5.0)
Alkaline Phosphatase: 94 U/L (ref 38–126)
Anion gap: 8 (ref 5–15)
BUN: 10 mg/dL (ref 6–20)
CO2: 28 mmol/L (ref 22–32)
Calcium: 8.9 mg/dL (ref 8.9–10.3)
Chloride: 104 mmol/L (ref 98–111)
Creatinine, Ser: 0.79 mg/dL (ref 0.61–1.24)
GFR, Estimated: >60 mL/min (ref >60)
Glucose, Bld: 103 mg/dL — ABNORMAL HIGH (ref 70–99)
Potassium: 3.9 mmol/L (ref 3.5–5.1)
Sodium: 140 mmol/L (ref 135–145)
Total Bilirubin: 0.4 mg/dL (ref 0.3–1.2)
Total Protein: 5.8 g/dL — ABNORMAL LOW (ref 6.5–8.1)

## 2020-11-08 LAB — CBC
HCT: 34.6 % — ABNORMAL LOW (ref 39.0–52.0)
Hemoglobin: 11.4 g/dL — ABNORMAL LOW (ref 13.0–17.0)
MCH: 32.4 pg (ref 26.0–34.0)
MCHC: 32.9 g/dL (ref 30.0–36.0)
MCV: 98.3 fL (ref 80.0–100.0)
Platelets: 250 10*3/uL (ref 150–400)
RBC: 3.52 MIL/uL — ABNORMAL LOW (ref 4.22–5.81)
RDW: 11.6 % (ref 11.5–15.5)
WBC: 6.8 10*3/uL (ref 4.0–10.5)
nRBC: 0 % (ref 0.0–0.2)

## 2020-11-08 MED ORDER — OLANZAPINE 5 MG PO TBDP
5.0000 mg | ORAL_TABLET | Freq: Every day | ORAL | Status: DC
  Administered 2020-11-09 – 2020-11-23 (×13): 5 mg via ORAL
  Filled 2020-11-08 (×15): qty 1

## 2020-11-08 MED ORDER — OLANZAPINE 5 MG PO TBDP
10.0000 mg | ORAL_TABLET | Freq: Every day | ORAL | Status: DC
  Administered 2020-11-08 – 2020-11-22 (×15): 10 mg via ORAL
  Filled 2020-11-08 (×16): qty 2

## 2020-11-08 NOTE — Consult Note (Signed)
[facility] Face-to-Face Psychiatry Consult   Reason for Consult:  Erratic behavior, AMS Referring Physician:  Holli Humbles, MD Patient Identification: Benjamin [name] MRN:  [id number] Principal Diagnosis: Encephalopathy acute Diagnosis:  Principal Problem:   Encephalopathy acute Active Problems:   Seizure disorder ([place])   Lung neoplasm   Other fracture of left great toe, initial encounter for closed fracture   Encephalopathy   Total Time spent with patient: 30 minutes  Subjective:   Benjamin [name] is a 56 y.o. male patient admitted with medical history significant for seizure disorder who presented to [facility] emergency room with altered mental status . Per EMR patient also has a PPH of schizophrenia vs schizoaffective disorder, it remains unclear if patient had OP Psych. Patient was recently diagnosed with Lung cancer 10/2020.  HPI: Patient was noted to be very agitated, anxious, and unccoperative at [facility] ED and required Haldol, Benadryl, and Ativan. Per EMR handoff noted that patient had been brought by his wife because he was "tearing up the house." Patient was started on zyprexa 5 mg BID as recommended by psychiatry and appeared improved yesterday.    On chart review, he received 5 mg IM haldol at 16:39 yesterday,  2.5mg  IV haldol at 3:58am, and a total of 6 mg IV ativan over the last 24 hours.  and was compliant with morning zyprexa but was too sedated to receive second dose . On assessment this AM patient is awake and sitting in bed with soft wrist restraints in place. Patient remains disoriented and perseverates on "my hands are tied up".  He is oriented to self, , hospital, and year. When asked what city we are currently in, patient repeated "the same place as yesterday" but was unable to clarify further. Patient perseverates on wrist restraints throughout interaction and repeatedly states "my hands are tied up" everytime he is asked a question. He denies SI/HI/AVH. Upon  exiting room, patient appeared confused and started talking about his landlord and was asking to hold my clipboard.   I spoke to sitter bedside who reports that patient did not sleep at all last night and needed PRN; states that patient was agitated yesterday. This morning he had one of his arms free from restraints so that he could eat breakfast but then attempted to remove IV and so restraint was replaced.    Past Psychiatric History: Schizophrenia vs schizoaffective disorder Wife reports that patient had no psych hx prior to 09/2020. His fisrt psychotic break was in 09/2020 and led to him being hospitalized and his second was 10/2020. Wife reports that he has never gone nights with sleep. She does report that patient smoked THC daily from age 43 y until 3 mon ago. Patient apparently stopped smoking after he became paranoid about 3 mon ago. Wife does report that patient has a brother with schizophrenia.   Risk to Self:   NO Risk to Others:  NO Prior Inpatient Therapy:   Yes [facility] 5/18-5/23 Of note patient was transferred out of [facility] as he appeared to be more agitated  (requiring Geodon) than usual but also appeared more quiet. Patient was transferred to a medical hospital where he was found to have Lung Cancer 5/23.  Per EMR patient appears to have been treated with Remeron and Abilify in the past.  Prior Outpatient Therapy:   UNknown  Past Medical History:  Past Medical History:  Diagnosis Date  . Anxiety   . ED (erectile dysfunction)   . Headache 06/01/2017  .  Seizure disorder ([place]) 06/01/2017  . TBI (traumatic brain injury) ([place])    1986    Past Surgical History:  Procedure Laterality Date  . colo-vesicular fistula     repair  . HEMORROIDECTOMY    . reconstructive surgery     right mandibular   Family History:  Family History  Problem Relation Age of Onset  . Hypercholesterolemia Mother   . Hypertension Mother   . Hypertension Father   . Heart attack Father    . Seizures Neg Hx    Family Psychiatric  History: Unknown Social History:  Social History   Substance and Sexual Activity  Alcohol Use No     Social History   Substance and Sexual Activity  Drug Use No    Social History   Socioeconomic History  . Marital status: Married    Spouse name: Not on file  . Number of children: Not on file  . Years of education: Not on file  . Highest education level: Not on file  Occupational History  . Not on file  Tobacco Use  . Smoking status: Current Every Day Smoker    Packs/day: 1.00    Types: Cigarettes  . Smokeless tobacco: Never Used  Substance and Sexual Activity  . Alcohol use: No  . Drug use: No  . Sexual activity: Not on file  Other Topics Concern  . Not on file  Social History Narrative  . Not on file   Social Determinants of Health   Financial Resource Strain: Not on file  Food Insecurity: Not on file  Transportation Needs: Not on file  Physical Activity: Not on file  Stress: Not on file  Social Connections: Not on file   Additional Social History:    Allergies:   Allergies  Allergen Reactions  . Peanut-Containing Drug Products     Diverticulitis     Labs:  Results for orders placed or performed during the hospital encounter of 11/04/20 (from the past 48 hour(s))  CBC     Status: Abnormal   Collection Time: 11/07/20  2:45 AM  Result Value Ref Range   WBC 7.0 4.0 - 10.5 K/uL   RBC 3.68 (L) 4.22 - 5.81 MIL/uL   Hemoglobin 12.1 (L) 13.0 - 17.0 g/dL   HCT 35.9 (L) 39.0 - 52.0 %   MCV 97.6 80.0 - 100.0 fL   MCH 32.9 26.0 - 34.0 pg   MCHC 33.7 30.0 - 36.0 g/dL   RDW 11.6 11.5 - 15.5 %   Platelets 270 150 - 400 K/uL   nRBC 0.0 0.0 - 0.2 %    Comment: Performed at [facility], [place] [address] [place], [place] [address]  Comprehensive metabolic panel     Status: Abnormal   Collection Time: 11/07/20  2:45 AM  Result Value Ref Range   Sodium 139 135 - 145 mmol/L   Potassium 3.4 (L) 3.5 - 5.1 mmol/L    Chloride 102 98 - 111 mmol/L   CO2 25 22 - 32 mmol/L   Glucose, Bld 114 (H) 70 - 99 mg/dL    Comment: Glucose reference range applies only to samples taken after fasting for at least 8 hours.   BUN 12 6 - 20 mg/dL   Creatinine, Ser 0.78 0.61 - 1.24 mg/dL   Calcium 9.1 8.9 - 10.3 mg/dL   Total Protein 6.6 6.5 - 8.1 g/dL   Albumin 3.0 (L) 3.5 - 5.0 g/dL   AST 20 15 - 41 U/L  ALT 19 0 - 44 U/L   Alkaline Phosphatase 84 38 - 126 U/L   Total Bilirubin 0.7 0.3 - 1.2 mg/dL   GFR, Estimated >60 >60 mL/min    Comment: (NOTE) Calculated using the CKD-EPI Creatinine Equation (2021)    Anion gap 12 5 - 15    Comment: Performed at [facility] [address], [place], [place] [address]  CBC     Status: Abnormal   Collection Time: 11/08/20  2:07 AM  Result Value Ref Range   WBC 6.8 4.0 - 10.5 K/uL   RBC 3.52 (L) 4.22 - 5.81 MIL/uL   Hemoglobin 11.4 (L) 13.0 - 17.0 g/dL   HCT 34.6 (L) 39.0 - 52.0 %   MCV 98.3 80.0 - 100.0 fL   MCH 32.4 26.0 - 34.0 pg   MCHC 32.9 30.0 - 36.0 g/dL   RDW 11.6 11.5 - 15.5 %   Platelets 250 150 - 400 K/uL   nRBC 0.0 0.0 - 0.2 %    Comment: Performed at [facility], [place] [address] [place], [place] [address]  Comprehensive metabolic panel     Status: Abnormal   Collection Time: 11/08/20  2:07 AM  Result Value Ref Range   Sodium 140 135 - 145 mmol/L   Potassium 3.9 3.5 - 5.1 mmol/L   Chloride 104 98 - 111 mmol/L   CO2 28 22 - 32 mmol/L   Glucose, Bld 103 (H) 70 - 99 mg/dL    Comment: Glucose reference range applies only to samples taken after fasting for at least 8 hours.   BUN 10 6 - 20 mg/dL   Creatinine, Ser 0.79 0.61 - 1.24 mg/dL   Calcium 8.9 8.9 - 10.3 mg/dL   Total Protein 5.8 (L) 6.5 - 8.1 g/dL   Albumin 2.7 (L) 3.5 - 5.0 g/dL   AST 79 (H) 15 - 41 U/L   ALT 52 (H) 0 - 44 U/L   Alkaline Phosphatase 94 38 - 126 U/L   Total Bilirubin 0.4 0.3 - 1.2 mg/dL   GFR, Estimated >60 >60 mL/min    Comment: (NOTE) Calculated using the CKD-EPI  Creatinine Equation (2021)    Anion gap 8 5 - 15    Comment: Performed at [facility], [place] [address], [place], [place] [address]    Current Facility-Administered Medications  Medication Dose Route Frequency Provider Last Rate Last Admin  . acetaminophen (TYLENOL) tablet 650 mg  650 mg Oral Q6H PRN Chotiner, Yevonne Aline, MD   650 mg at 11/08/20 1052   Or  . acetaminophen (TYLENOL) suppository 650 mg  650 mg Rectal Q6H PRN Chotiner, Yevonne Aline, MD      . enoxaparin (LOVENOX) injection 40 mg  40 mg Subcutaneous Q24H Karren Cobble, RPH   40 mg at 11/08/20 1100  . haloperidol lactate (HALDOL) injection 2.5 mg  2.5 mg Intravenous Q6H PRN Damita Dunnings B, MD   2.5 mg at 11/08/20 0358   Or  . haloperidol lactate (HALDOL) injection 5 mg  5 mg Intramuscular Q6H PRN Damita Dunnings B, MD   5 mg at 11/07/20 1639  . lactated ringers infusion   Intravenous Continuous Little Ishikawa, MD 50 mL/hr at 11/08/20 1205 Rate Change at 11/08/20 1205  . levETIRAcetam (KEPPRA) IVPB 500 mg/100 mL premix  500 mg Intravenous Q12H Chotiner, Yevonne Aline, MD 400 mL/hr at 11/08/20 1059 500 mg at 11/08/20 1059  . LORazepam (ATIVAN) injection 2 mg  2  mg Intravenous Q6H PRN Damita Dunnings B, MD   2 mg at 11/08/20 1200   Or  . LORazepam (ATIVAN) injection 2 mg  2 mg Intramuscular Q6H PRN Freida Busman, MD      . Derrill Memo ON 11/09/2020] OLANZapine zydis (ZYPREXA) disintegrating tablet 5 mg  5 mg Oral Daily Ival Bible, MD       And  . OLANZapine zydis (ZYPREXA) disintegrating tablet 10 mg  10 mg Oral QHS Ival Bible, MD      . ondansetron [facility]) tablet 4 mg  4 mg Oral Q6H PRN Chotiner, Yevonne Aline, MD       Or  . ondansetron (ZOFRAN) injection 4 mg  4 mg Intravenous Q6H PRN Chotiner, Yevonne Aline, MD        Musculoskeletal: Strength & Muscle Tone: within normal limits Gait & Station: deferred patient remains in bed Patient leans: N/A            Psychiatric Specialty  Exam:  Presentation  General Appearance: Appropriate for Environment; Casual (sitting in bed with soft wrist restraints in place)  Eye Contact:Fair  Speech:Clear and Coherent; Normal Rate  Speech Volume:Decreased  Handedness:No data recorded  Mood and Affect  Mood:Dysphoric; Irritable  Affect:Constricted   Thought Process  Thought Processes:Linear  Descriptions of Associations:Intact  Orientation:Other (comment) (oriented to hospital and year. perseverates on "my hands are tied up" when asked other orientation questoins)  Thought Content:Perseveration  History of Schizophrenia/Schizoaffective disorder:No data recorded Duration of Psychotic Symptoms:No data recorded Hallucinations:Hallucinations: None  Ideas of Reference:None  Suicidal Thoughts:Suicidal Thoughts: No  Homicidal Thoughts:Homicidal Thoughts: No   Sensorium  Memory:Immediate Fair; Recent Poor; Remote Poor  Judgment:Impaired  Insight:Lacking   Executive Functions  Concentration:Fair  Attention Span:Fair  [place]   Psychomotor Activity  Psychomotor Activity:Psychomotor Activity: Normal   Assets  Assets:Communication Skills; Desire for Improvement; Resilience   Sleep  Sleep:Sleep: Poor   Physical Exam: Physical Exam Constitutional:      Comments: Looks confused  HENT:     Head: Normocephalic and atraumatic.  Eyes:     Extraocular Movements: Extraocular movements intact.     Conjunctiva/sclera: Conjunctivae normal.  Pulmonary:     Effort: Pulmonary effort is normal.  Skin:    General: Skin is warm and dry.  Neurological:     Mental Status: He is alert. He is disoriented.    Review of Systems  Psychiatric/Behavioral: Negative for depression, hallucinations and suicidal ideas.   Blood pressure 135/75, pulse 73, temperature 97.9 F (36.6 C), resp. rate 14, height 5\' 7"  (1.702 m), weight 51.5 kg, SpO2 100 %. Body mass index is  17.78 kg/m.  Treatment Plan Summary: Daily contact with patient to assess and evaluate symptoms and progress in treatment   Hx of schizophrenia vs schizoaffective disorder Delirium Wife reports that patient had no psych hx prior to 09/2020. His fisrt psychotic break was in 09/2020 and led to him being hospitalized and his second was 10/2020. Wife reports that he has never gone nights with sleep. She does report that patient smoked THC daily from age 24 y until 3 mon ago. Patient apparently stopped smoking after he became paranoid about 3 mon ago. Wife does report that patient has a brother with schizophrenia. It is uncommon for individuals to be diagnosed with schizophrenia spectrum disorders for the first time in their later 75s; unclear veracity of diagnosis as could be related to possible delirium that has been long standing in  the setting of lung neoplasm and possible contributing effects of marijuana use.  - Increase zyprexa zydis 5 mg BID to 5 mg daily and 10 mg qhs - Haldol 5mg  IM or 2.5mg  IV and Ativan 2 mg IM or IV for agitation protocol -recommend that patient not receive ativan alone for agitation and that it can be given in conjunction with haldol as benzos can worsen delirium   Recommendations - EKG, patient's EMR reports that patient is regularly taking Elavil which can induce prolonged QTc - Continue sitter as primary team deems necessary - Trial without wrist restraints as patient appears to be more calm  Disposition: Psychiatry will continue to follow . Does not meet criteria for inpatient psychiatric admission at this time based on today's evaluation.   Discussed recommendations via secure chat with Dr. Adline Mango, MD  Attending psychiatrist 11/08/2020 12:20 PM

## 2020-11-08 NOTE — Progress Notes (Signed)
PROGRESS NOTE    Benjamin [name]  ZGY:[id number] DOB: 08-06-1965 DOA: 11/04/2020 PCP: Glenda Chroman, MD   Brief Narrative:  Benjamin [name] is a 55 y.o. male with medical history significant for seizure disorder who presented to [facility] emergency room with altered mental status.  His wife reported that he had been acting very erratically.  He was complaining of pain in his left foot.  Reportedly he had an injury to his left foot at home in the last day or 2 and has had pain in the foot since then.  He was found to have fracture of his left first toe.  Placed in a postop shoe in the emergency room.  He had an erratic behavior and was very anxious and uncooperative and he was dosed with Haldol Ativan and Benadryl in the emergency room.  Chest x-ray revealed a lingular pneumonia versus a central lung mass.  CT of his chest was obtained which showed a mass in the left lingula and surrounding postobstructive pneumonitis.  The mass extended into the left hilum and he had left hilar adenopathy.  He also had a mass in the right upper lobe.  MRI was obtained to make sure there is no metastasis that would be causing his symptoms.  MRI did not reveal any intracranial lesions.  There was no interventional radiology, pulmonology, oncology available at [facility] for further work-up so patient was transferred to [facility].   Assessment & Plan:   Principal Problem:   Encephalopathy acute Active Problems:   Seizure disorder ([place])   Lung neoplasm   Other fracture of left great toe, initial encounter for closed fracture   Encephalopathy  Acute versus subacute encephalopathy vs acute psychotic break - unclear etiology, POA Questionable diagnosis of schizophrenia versus schizoaffective disorder -Patient has known history of seizure, indicates he has been noncompliant with medications - EEG unremarkable for seizure-like activity -MRI brain unremarkable -Patient also admits to smoking marijuana, denies other  illicit substances, denies alcohol use - should be outside of withdrawal period at this point -Psychiatry consulted, appreciate insight and recommendations given no clear organic etiology for his mental status changes in behavior we do appreciate their assistance with medication changes and further diagnosis. Initial psychotic break April this year(04/22) - ? Diagnosis of schizophrenia/schizoaffective disorder at that time but this would be a new diagnosis for the patient -Patient still combative overnight, poor sleep hygiene overnight, increasing medications per psych recommendations.  Incidental lung nodule/opacification - On routine imaging CT of chest showed to have nodules concerning for cancer  -Unfortunately patient currently unwilling or unable to cooperate with further imaging and will hold off given nonurgent nature.  Will likely need outpatient follow-up for further imaging and evaluation for incidental nodule  Seizure disorder -Cannot rule out acute breakthrough seizure with postictal state due to noncompliance - patient is well outside the window for postictal state at this point -Patient admits to being on Keppra and phenobarbital with poor compliance  -Continue IV antiepileptics until patient is more compliant with p.o. intake  Other fracture of left great toe, initial encounter for closed fracture Post op shoe was placed in the emergency room and will be continued.  No further indication for imaging or intervention at this time. Pain well controlled.  DVT prophylaxis:  Lovenox Code Status:  Full code  Family Communication: Wife over the phone  Status is: Inpatient  Dispo: The patient is from: Home  Anticipated d/c is to: To be determined              Anticipated d/c date is: >72 hours              Patient currently not medically stable for discharge  Consultants:   None  Procedures:   None  Antimicrobials:  None indicated  Subjective: Patient up  most of the night remains combative with staff review of systems markedly limited today after increased Ativan dosing  Objective: Vitals:   11/07/20 0721 11/07/20 0723 11/07/20 1220 11/07/20 2202  BP:  (!) 151/78 (!) 141/87 135/75  Pulse:  79 78 73  Resp:  18 18 14   Temp: 97.9 F (36.6 C) 97.9 F (36.6 C) 97.7 F (36.5 C) 97.9 F (36.6 C)  TempSrc: Axillary Axillary Axillary   SpO2:  98% 100% 100%  Weight:      Height:        Intake/Output Summary (Last 24 hours) at 11/08/2020 7322 Last data filed at 11/08/2020 0437 Gross per 24 hour  Intake 400 ml  Output 600 ml  Net -200 ml   Filed Weights   11/04/20 2343  Weight: 51.5 kg    Examination:  General:  Pleasantly resting in bed, No acute distress.  Cachectic appearing HEENT:  Normocephalic atraumatic.  Sclerae nonicteric, noninjected.  Extraocular movements intact bilaterally. Neck:  Without mass or deformity.  Trachea is midline. Lungs:  Clear to auscultate bilaterally without rhonchi, wheeze, or rales. Heart:  Regular rate and rhythm.  Without murmurs, rubs, or gallops. Abdomen:  Soft, nontender, nondistended.  Without guarding or rebound. Extremities: Without cyanosis, clubbing, edema, or obvious deformity. Vascular:  Dorsalis pedis and posterior tibial pulses palpable bilaterally. Skin:  Warm and dry, no erythema, no ulcerations. Neuro/psych: Unable to evaluate today, patient somnolent postmedication administration  Data Reviewed: I have personally reviewed following labs and imaging studies  CBC: Recent Labs  Lab 11/05/20 0221 11/07/20 0245 11/08/20 0207  WBC 5.3 7.0 6.8  HGB 10.7* 12.1* 11.4*  HCT 33.0* 35.9* 34.6*  MCV 99.4 97.6 98.3  PLT 248 270 025   Basic Metabolic Panel: Recent Labs  Lab 11/05/20 0221 11/07/20 0245 11/08/20 0207  NA 140 139 140  K 3.4* 3.4* 3.9  CL 106 102 104  CO2 27 25 28   GLUCOSE 104* 114* 103*  BUN 18 12 10   CREATININE 1.00 0.78 0.79  CALCIUM 8.4* 9.1 8.9    GFR: Estimated Creatinine Clearance: 74.2 mL/min (by C-G formula based on SCr of 0.79 mg/dL). Liver Function Tests: Recent Labs  Lab 11/05/20 0221 11/07/20 0245 11/08/20 0207  AST 15 20 79*  ALT 13 19 52*  ALKPHOS 70 84 94  BILITOT 0.3 0.7 0.4  PROT 5.9* 6.6 5.8*  ALBUMIN 2.8* 3.0* 2.7*   No results for input(s): LIPASE, AMYLASE in the last 168 hours. No results for input(s): AMMONIA in the last 168 hours. Coagulation Profile: No results for input(s): INR, PROTIME in the last 168 hours. Cardiac Enzymes: No results for input(s): CKTOTAL, CKMB, CKMBINDEX, TROPONINI in the last 168 hours. BNP (last 3 results) No results for input(s): PROBNP in the last 8760 hours. HbA1C: No results for input(s): HGBA1C in the last 72 hours. CBG: No results for input(s): GLUCAP in the last 168 hours. Lipid Profile: No results for input(s): CHOL, HDL, LDLCALC, TRIG, CHOLHDL, LDLDIRECT in the last 72 hours. Thyroid Function Tests: No results for input(s): TSH, T4TOTAL, FREET4, T3FREE, THYROIDAB in the last 72 hours. Anemia  Panel: No results for input(s): VITAMINB12, FOLATE, FERRITIN, TIBC, IRON, RETICCTPCT in the last 72 hours. Sepsis Labs: No results for input(s): PROCALCITON, LATICACIDVEN in the last 168 hours.  No results found for this or any previous visit (from the past 240 hour(s)).   Radiology Studies: No results found.  Scheduled Meds: . enoxaparin (LOVENOX) injection  40 mg Subcutaneous Q24H  . OLANZapine zydis  5 mg Oral BID   Continuous Infusions: . lactated ringers 100 mL/hr at 11/07/20 0224  . levETIRAcetam 500 mg (11/07/20 2212)     LOS: 4 days   Time spent: 25min  Brocha Gilliam C Leigha Olberding, DO [facility]  If 7PM-7AM, please contact night-coverage [email or web address]  11/08/2020, 7:02 AM

## 2020-11-09 ENCOUNTER — Encounter (HOSPITAL_COMMUNITY): Payer: Self-pay | Admitting: Family Medicine

## 2020-11-09 LAB — COMPREHENSIVE METABOLIC PANEL
ALT: 47 U/L — ABNORMAL HIGH (ref 0–44)
AST: 46 U/L — ABNORMAL HIGH (ref 15–41)
Albumin: 2.9 g/dL — ABNORMAL LOW (ref 3.5–5.0)
Alkaline Phosphatase: 88 U/L (ref 38–126)
Anion gap: 9 (ref 5–15)
BUN: 8 mg/dL (ref 6–20)
CO2: 30 mmol/L (ref 22–32)
Calcium: 9.2 mg/dL (ref 8.9–10.3)
Chloride: 103 mmol/L (ref 98–111)
Creatinine, Ser: 0.74 mg/dL (ref 0.61–1.24)
GFR, Estimated: >60 mL/min (ref >60)
Glucose, Bld: 95 mg/dL (ref 70–99)
Potassium: 3.8 mmol/L (ref 3.5–5.1)
Sodium: 142 mmol/L (ref 135–145)
Total Bilirubin: 0.7 mg/dL (ref 0.3–1.2)
Total Protein: 6.6 g/dL (ref 6.5–8.1)

## 2020-11-09 LAB — CBC
HCT: 36.5 % — ABNORMAL LOW (ref 39.0–52.0)
Hemoglobin: 11.9 g/dL — ABNORMAL LOW (ref 13.0–17.0)
MCH: 32 pg (ref 26.0–34.0)
MCHC: 32.6 g/dL (ref 30.0–36.0)
MCV: 98.1 fL (ref 80.0–100.0)
Platelets: 231 10*3/uL (ref 150–400)
RBC: 3.72 MIL/uL — ABNORMAL LOW (ref 4.22–5.81)
RDW: 11.5 % (ref 11.5–15.5)
WBC: 6.3 10*3/uL (ref 4.0–10.5)
nRBC: 0 % (ref 0.0–0.2)

## 2020-11-09 NOTE — Plan of Care (Signed)

## 2020-11-09 NOTE — Progress Notes (Signed)
PROGRESS NOTE    Benjamin [name]  IPJ:[id number] DOB: 1966/03/26 DOA: 11/04/2020 PCP: Glenda Chroman, MD   Brief Narrative:  Benjamin [name] is a 55 y.o. male with medical history significant for seizure disorder who presented to [facility] emergency room with altered mental status.  His wife reported that he had been acting very erratically.  He was complaining of pain in his left foot.  Reportedly he had an injury to his left foot at home in the last day or 2 and has had pain in the foot since then.  He was found to have fracture of his left first toe.  Placed in a postop shoe in the emergency room.  He had an erratic behavior and was very anxious and uncooperative and he was dosed with Haldol Ativan and Benadryl in the emergency room.  Chest x-ray revealed a lingular pneumonia versus a central lung mass.  CT of his chest was obtained which showed a mass in the left lingula and surrounding postobstructive pneumonitis.  The mass extended into the left hilum and he had left hilar adenopathy.  He also had a mass in the right upper lobe.  MRI was obtained to make sure there is no metastasis that would be causing his symptoms.  MRI did not reveal any intracranial lesions.  There was no interventional radiology, pulmonology, oncology available at [facility] for further work-up so patient was transferred to [facility].  Imaging at outside facility CT chest 11/04/2020 8:38 AM EDT  1. There is an apparent mass arising in the lingula with suspected surrounding postobstructive pneumonitis. This mass extends into the left hilum with apparent left hilar adenopathy which is difficult to delineate from the dominant lingular mass. Advise consideration for Pulmonary Medicine consultation for potential bronchoscopy to obtain tissue for further assessment. 2. Small areas of opacity in the right upper lobe, likely foci of neoplasm with adjacent scarring. Nuclear medicine PET study could be helpful for further  characterization of the smaller lesions. 3. Areas of apparent adenopathy in the subcarinal and right hilar regions as well as the larger apparent adenopathy in the left hilar region. 4. Aortic atherosclerosis. There are foci great vessel and coronary artery calcification.  CTA 10/27/2020 IMPRESSION: 1. No acute pulmonary emboli. 2. Unchanged large left hilar/perihilar cavitary necrotic masslike consolidation in left upper lobe. Other cavitary pulmonary nodules bilaterally. Left hilar lymphadenopathy. Findings highly suspicious for malignancy. 3. Gastric wall thickening may represent gastritis or other etiologies. Follow-up recommended.    Assessment & Plan:   Principal Problem:   Encephalopathy acute Active Problems:   Seizure disorder ([place])   Lung neoplasm   Other fracture of left great toe, initial encounter for closed fracture   Encephalopathy  Acute versus subacute encephalopathy vs acute psychotic break - unclear etiology, POA Questionable diagnosis of schizophrenia versus schizoaffective disorder, POA -Patient has known history of seizure, indicates he has been noncompliant with medications - EEG unremarkable for seizure-like activity -MRI brain unremarkable -Patient also admits to smoking marijuana, denies other illicit substances, denies alcohol use - should be outside of withdrawal period at this point -Psychiatry consulted, appreciate insight and recommendations given no clear organic etiology for his mental status changes in behavior we do appreciate their assistance with medication changes and further diagnosis. Initial psychotic break April this year(04/22) - ? Diagnosis of schizophrenia/schizoaffective disorder at that time but this would be a new diagnosis for the patient -Patient remains markedly combative, continues to require Ativan and Haldol multiple times per day  to maintain safety at this point  Incidental lung nodule/opacification/mass, POA - On routine imaging  CT of chest showed to have nodules concerning for cancer  -Heme-onc formally consulted today to help further evaluate this patient, he continues to be altered which will make diagnosis and testing probably difficult but there is concern that his abnormal chest imaging may be related to his mental status changes as above -reassuringly CT head and MRI of brain at previous facility was without acute findings  Seizure disorder -Cannot rule out acute breakthrough seizure with postictal state due to noncompliance - patient is well outside the window for postictal state at this point -Patient admits to being on Keppra and phenobarbital with poor compliance  -Continue IV antiepileptics until patient is more compliant with p.o. intake  Other fracture of left great toe, initial encounter for closed fracture Post op shoe was placed in the emergency room and will be continued.  No further indication for imaging or intervention at this time. Pain well controlled.  DVT prophylaxis:  Lovenox Code Status:  Full code  Family Communication: Wife over the phone  Status is: Inpatient  Dispo: The patient is from: Home              Anticipated d/c is to: To be determined              Anticipated d/c date is: >72 hours              Patient currently not medically stable for discharge  Consultants:   None  Procedures:   None  Antimicrobials:  None indicated  Subjective: Patient remains markedly noncompliant, continues to remove restraints, received additional IV Ativan and Haldol this morning, review of systems during my interview was markedly limited given patient's mental status  Objective: Vitals:   11/08/20 1300 11/08/20 1943 11/08/20 2000 11/09/20 0505  BP:  (!) 175/102 (!) 169/93 (!) 155/93  Pulse:  71  94  Resp: 13 20  20   Temp:  98.2 F (36.8 C)  98.1 F (36.7 C)  TempSrc:  Axillary  Axillary  SpO2:  98%  98%  Weight:      Height:        Intake/Output Summary (Last 24 hours) at  11/09/2020 0714 Last data filed at 11/09/2020 0324 Gross per 24 hour  Intake 1249.57 ml  Output 550 ml  Net 699.57 ml   Filed Weights   11/04/20 2343  Weight: 51.5 kg    Examination:  General:  Pleasantly resting in bed, No acute distress.  Cachectic appearing HEENT:  Normocephalic atraumatic.  Sclerae nonicteric, noninjected.  Extraocular movements intact bilaterally. Neck:  Without mass or deformity.  Trachea is midline. Lungs:  Clear to auscultate bilaterally without rhonchi, wheeze, or rales. Heart:  Regular rate and rhythm.  Without murmurs, rubs, or gallops. Abdomen:  Soft, nontender, nondistended.  Without guarding or rebound. Extremities: Without cyanosis, clubbing, edema, or obvious deformity. Vascular:  Dorsalis pedis and posterior tibial pulses palpable bilaterally. Skin:  Warm and dry, no erythema, no ulcerations. Neuro/psych: Unable to evaluate today, patient somnolent postmedication administration  Data Reviewed: I have personally reviewed following labs and imaging studies  CBC: Recent Labs  Lab 11/05/20 0221 11/07/20 0245 11/08/20 0207 11/09/20 0439  WBC 5.3 7.0 6.8 6.3  HGB 10.7* 12.1* 11.4* 11.9*  HCT 33.0* 35.9* 34.6* 36.5*  MCV 99.4 97.6 98.3 98.1  PLT 248 270 250 591   Basic Metabolic Panel: Recent Labs  Lab 11/05/20 0221 11/07/20 0245  11/08/20 0207 11/09/20 0439  NA 140 139 140 142  K 3.4* 3.4* 3.9 3.8  CL 106 102 104 103  CO2 27 25 28 30   GLUCOSE 104* 114* 103* 95  BUN 18 12 10 8   CREATININE 1.00 0.78 0.79 0.74  CALCIUM 8.4* 9.1 8.9 9.2   GFR: Estimated Creatinine Clearance: 74.2 mL/min (by C-G formula based on SCr of 0.74 mg/dL). Liver Function Tests: Recent Labs  Lab 11/05/20 0221 11/07/20 0245 11/08/20 0207 11/09/20 0439  AST 15 20 79* 46*  ALT 13 19 52* 47*  ALKPHOS 70 84 94 88  BILITOT 0.3 0.7 0.4 0.7  PROT 5.9* 6.6 5.8* 6.6  ALBUMIN 2.8* 3.0* 2.7* 2.9*   No results for input(s): LIPASE, AMYLASE in the last 168 hours. No  results for input(s): AMMONIA in the last 168 hours. Coagulation Profile: No results for input(s): INR, PROTIME in the last 168 hours. Cardiac Enzymes: No results for input(s): CKTOTAL, CKMB, CKMBINDEX, TROPONINI in the last 168 hours. BNP (last 3 results) No results for input(s): PROBNP in the last 8760 hours. HbA1C: No results for input(s): HGBA1C in the last 72 hours. CBG: No results for input(s): GLUCAP in the last 168 hours. Lipid Profile: No results for input(s): CHOL, HDL, LDLCALC, TRIG, CHOLHDL, LDLDIRECT in the last 72 hours. Thyroid Function Tests: No results for input(s): TSH, T4TOTAL, FREET4, T3FREE, THYROIDAB in the last 72 hours. Anemia Panel: No results for input(s): VITAMINB12, FOLATE, FERRITIN, TIBC, IRON, RETICCTPCT in the last 72 hours. Sepsis Labs: No results for input(s): PROCALCITON, LATICACIDVEN in the last 168 hours.  No results found for this or any previous visit (from the past 240 hour(s)).   Radiology Studies: No results found.  Scheduled Meds: . enoxaparin (LOVENOX) injection  40 mg Subcutaneous Q24H  . OLANZapine zydis  5 mg Oral Daily   And  . OLANZapine zydis  10 mg Oral QHS   Continuous Infusions: . lactated ringers 50 mL/hr at 11/09/20 0100  . levETIRAcetam Stopped (11/08/20 2156)     LOS: 5 days   Time spent: 63min  Frances Joynt C Tanji Storrs, DO [facility]  If 7PM-7AM, please contact night-coverage [email or web address]  11/09/2020, 7:14 AM

## 2020-11-09 NOTE — Consult Note (Addendum)
[facility] CONSULT NOTE  Patient Care Team: Glenda Chroman, MD as PCP - General (Internal Medicine)  CHIEF COMPLAINTS/PURPOSE OF CONSULTATION:  Newly diagnosed lung mass with encephalopathy  HISTORY OF PRESENTING ILLNESS:  Benjamin [name] 55 y.o. male is here because of recent diagnosis of acute encephalopathy that required hospitalization initially at [facility].  At that facility they performed brain MRI as well as CT chest.  The MRI brain did not show any abnormalities.  The CT chest revealed left lingular mass extending to the left hilum with hilar lymph nodes.  There were also 2 additional areas of opacities in the right upper lobe.  Subcarinal and right hilar adenopathy.  He was transferred here for additional work-up. When I met him he was standing up and was trying to remove the IV line and trying to leave and go home.  He was pleasant but he unhooked his IV line while we were talking to him.  He was oriented to time but not to place and person.  He remembered his wife's name.  He told me that he was a smoker previously smoking 2 packs/day.  He has also lost 30 pounds recently according to him.  His wife was not available at his bedside.  I reviewed her records extensively and collaborated the history with the patient.  MEDICAL HISTORY:  Past Medical History:  Diagnosis Date  . Anxiety   . ED (erectile dysfunction)   . Headache 06/01/2017  . Seizure disorder ([place]) 06/01/2017  . TBI (traumatic brain injury) ([place])    1986    SURGICAL HISTORY: Past Surgical History:  Procedure Laterality Date  . colo-vesicular fistula     repair  . HEMORROIDECTOMY    . reconstructive surgery     right mandibular    SOCIAL HISTORY: Social History   Socioeconomic History  . Marital status: Married    Spouse name: Not on file  . Number of children: Not on file  . Years of education: Not on file  . Highest education level: Not on file  Occupational History  . Not on file   Tobacco Use  . Smoking status: Current Every Day Smoker    Packs/day: 1.00    Types: Cigarettes  . Smokeless tobacco: Never Used  Substance and Sexual Activity  . Alcohol use: No  . Drug use: No  . Sexual activity: Not on file  Other Topics Concern  . Not on file  Social History Narrative  . Not on file   Social Determinants of Health   Financial Resource Strain: Not on file  Food Insecurity: Not on file  Transportation Needs: Not on file  Physical Activity: Not on file  Stress: Not on file  Social Connections: Not on file  Intimate Partner Violence: Not on file    FAMILY HISTORY: Family History  Problem Relation Age of Onset  . Hypercholesterolemia Mother   . Hypertension Mother   . Hypertension Father   . Heart attack Father   . Schizophrenia Brother   . Seizures Neg Hx     ALLERGIES:  is allergic to peanut-containing drug products.  MEDICATIONS:  Current Facility-Administered Medications  Medication Dose Route Frequency Provider Last Rate Last Admin  . acetaminophen (TYLENOL) tablet 650 mg  650 mg Oral Q6H PRN Chotiner, Yevonne Aline, MD   650 mg at 11/08/20 1052   Or  . acetaminophen (TYLENOL) suppository 650 mg  650 mg Rectal Q6H PRN Chotiner, Yevonne Aline, MD      .  enoxaparin (LOVENOX) injection 40 mg  40 mg Subcutaneous Q24H Norva Pavlov, RPH   40 mg at 11/08/20 1100  . haloperidol lactate (HALDOL) injection 2.5 mg  2.5 mg Intravenous Q6H PRN Eliseo Gum B, MD   2.5 mg at 11/09/20 1540   Or  . haloperidol lactate (HALDOL) injection 5 mg  5 mg Intramuscular Q6H PRN Eliseo Gum B, MD   5 mg at 11/07/20 1639  . lactated ringers infusion   Intravenous Continuous Azucena Fallen, MD 50 mL/hr at 11/09/20 0803 New Bag at 11/09/20 0803  . levETIRAcetam (KEPPRA) IVPB 500 mg/100 mL premix  500 mg Intravenous Q12H Chotiner, Claudean Severance, MD 400 mL/hr at 11/09/20 0815 500 mg at 11/09/20 0815  . LORazepam (ATIVAN) injection 2 mg  2 mg Intravenous Q6H PRN  Eliseo Gum B, MD   2 mg at 11/09/20 1532   Or  . LORazepam (ATIVAN) injection 2 mg  2 mg Intramuscular Q6H PRN Eliseo Gum B, MD      . OLANZapine zydis (ZYPREXA) disintegrating tablet 5 mg  5 mg Oral Daily Estella Husk, MD   5 mg at 11/09/20 0950   And  . OLANZapine zydis (ZYPREXA) disintegrating tablet 10 mg  10 mg Oral QHS Estella Husk, MD   10 mg at 11/08/20 2127  . ondansetron (ZOFRAN) tablet 4 mg  4 mg Oral Q6H PRN Chotiner, Claudean Severance, MD       Or  . ondansetron (ZOFRAN) injection 4 mg  4 mg Intravenous Q6H PRN Chotiner, Claudean Severance, MD        REVIEW OF SYSTEMS:   As above and the review of systems was difficult given his encephalopathy. All other systems were reviewed with the patient and are negative.  PHYSICAL EXAMINATION: ECOG PERFORMANCE STATUS: 2 - Symptomatic, <50% confined to bed  Vitals:   11/09/20 0505 11/09/20 1233  BP: (!) 155/93 137/87  Pulse: 94 78  Resp: 20 13  Temp: 98.1 F (36.7 C) 97.7 F (36.5 C)  SpO2: 98% 98%   Filed Weights   11/04/20 2343  Weight: 113 lb 8.6 oz (51.5 kg)   Exam not performed because of confusion and disorientation. LABORATORY DATA:  I have reviewed the data as listed Lab Results  Component Value Date   WBC 6.3 11/09/2020   HGB 11.9 (L) 11/09/2020   HCT 36.5 (L) 11/09/2020   MCV 98.1 11/09/2020   PLT 231 11/09/2020   Lab Results  Component Value Date   NA 142 11/09/2020   K 3.8 11/09/2020   CL 103 11/09/2020   CO2 30 11/09/2020    RADIOGRAPHIC STUDIES: I have personally reviewed the radiological reports and agreed with the findings in the report.  ASSESSMENT AND PLAN:  1.  Left lingular mass extending into the left hilum and hilar and mediastinal lymph nodes in addition to right upper lung nodules: Very concerning for cancer. Request pulmonary consultation to discuss bronchoscopy and biopsy. 2. delirium: Unclear etiology.  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  Unfortunately these antibodies are not available to order through epic.  Since it does not change clinical management, we can monitor and wait for lung biopsy results.  We will await the results of lung biopsies before proceeding with any systemic treatment options. All questions were answered. The patient knows to call the clinic with any problems, questions or concerns.    Tamsen Meek, MD @T @

## 2020-11-09 NOTE — Consult Note (Signed)
[facility] Face-to-Face Psychiatry Consult   Reason for Consult:  Erratic behavior, AMS Referring Physician:  Holli Humbles, MD Patient Identification: Benjamin [name] MRN:  [id number] Principal Diagnosis: Encephalopathy acute Diagnosis:  Principal Problem:   Encephalopathy acute Active Problems:   Seizure disorder ([place])   Lung neoplasm   Other fracture of left great toe, initial encounter for closed fracture   Encephalopathy   Total Time spent with patient: 20 minutes  Subjective:   Darelle Kings is a 55 y.o. male patient admitted withmedical history significant forseizure disorder who presented to [facility] emergency room with altered mental status . Per EMR patient also has a PPH of schizophrenia vs schizoaffective disorder, per wife patient did not have OP psych. Patient was recently diagnosed with Lung cancer 10/2020. Marland Kitchen  HPI:   Patient was noted to be very agitated, anxious, and unccoperative at [facility] ED and required Haldol, Benadryl, and Ativan. Per EMR handoff noted that patient had been brought by his wife because he was "tearing up the house."  Overnight patient required 2.5mg  of Haldol and 2mg  Ativan for agitation. Patient also continues to require wrist restraints and hand mittens as he continues to grab at the wires on him despite being in the wrist restraints.  On assessment this AM patient is not oriented to time he initially says the year is 2023 but after given a second attempt he can correct to 2022. Patient reported that the month was March. Patient continues to believe he is in the hospital in [place], [place]. Patient has been compliant with his scheduled zyprexa. Patient continues to eat well. Patient does ask multiple times to get up and walk and reports that he intends to go home. Patient did not understand why he cannot go home or why he was in the hospital. Patient has less perseveration of speech and topics today, but did endorse AH. While with patient, provider took  of hand mittens so patient could eat. Patient reported that he was going to drop the mitten on the floor and slowly made the motion to do . Provider asked patient not to, but he did and reported that "the voices told me to." Patient endorsed that he has been hearing voices at other times and they tell him to do certain things. Patient denied SI, HI, and VH. Patient did not appear manic but moreso, delirious as he thought process seemed very irrelevant.  Spoke w/ wife [name]. [name] reports she is not able to care for patient at the leve he requires at this time. [name] reports that each time he was discharged from [facility] the patient was stable but after only approx 2 days he would decompensate again. [name] reports she has to have 6 locks on her bedroom door because he can become physically abusive when he gets paranoid and delusional and begins to accuse her of things. [name] also reports her work hours prevent her from being able to make sure that he is taking his medications as prescribed.   Past Psychiatric History:  Wife reports that patient had no psych hx prior to 09/2020. His fisrt psychotic break was in 09/2020 and led to him being hospitalized and his second was 10/2020. Wife reports that he has never gone nights without sleep. She does report that patient smoked THC daily from age 33 y until 3 mon ago. Patient apparently stopped smoking after he became paranoid about 3 mon ago. Wife does report that patient has a brother with schizophrenia. Risk to Self:  NO Risk to Others:  NO, but wife reports he has been risk to her at home when he becomes paranoid over the last 2 months. Prior Inpatient Therapy:   Yes [facility] 09/2020 and 10/2020 Prior Outpatient Therapy:  No per wife  Past Medical History:  Past Medical History:  Diagnosis Date  . Anxiety   . ED (erectile dysfunction)   . Headache 06/01/2017  . Seizure disorder ([place]) 06/01/2017  . TBI (traumatic brain injury) ([place])    1986    Past  Surgical History:  Procedure Laterality Date  . colo-vesicular fistula     repair  . HEMORROIDECTOMY    . reconstructive surgery     right mandibular   Family History:  Family History  Problem Relation Age of Onset  . Hypercholesterolemia Mother   . Hypertension Mother   . Hypertension Father   . Heart attack Father   . Seizures Neg Hx    Family Psychiatric  History: Brother schizophrenia, per wife Social History:  Social History   Substance and Sexual Activity  Alcohol Use No     Social History   Substance and Sexual Activity  Drug Use No    Social History   Socioeconomic History  . Marital status: Married    Spouse name: Not on file  . Number of children: Not on file  . Years of education: Not on file  . Highest education level: Not on file  Occupational History  . Not on file  Tobacco Use  . Smoking status: Current Every Day Smoker    Packs/day: 1.00    Types: Cigarettes  . Smokeless tobacco: Never Used  Substance and Sexual Activity  . Alcohol use: No  . Drug use: No  . Sexual activity: Not on file  Other Topics Concern  . Not on file  Social History Narrative  . Not on file   Social Determinants of Health   Financial Resource Strain: Not on file  Food Insecurity: Not on file  Transportation Needs: Not on file  Physical Activity: Not on file  Stress: Not on file  Social Connections: Not on file   Additional Social History:    Allergies:   Allergies  Allergen Reactions  . Peanut-Containing Drug Products     Diverticulitis     Labs:  Results for orders placed or performed during the hospital encounter of 11/04/20 (from the past 48 hour(s))  CBC     Status: Abnormal   Collection Time: 11/08/20  2:07 AM  Result Value Ref Range   WBC 6.8 4.0 - 10.5 K/uL   RBC 3.52 (L) 4.22 - 5.81 MIL/uL   Hemoglobin 11.4 (L) 13.0 - 17.0 g/dL   HCT 34.6 (L) 39.0 - 52.0 %   MCV 98.3 80.0 - 100.0 fL   MCH 32.4 26.0 - 34.0 pg   MCHC 32.9 30.0 - 36.0 g/dL    RDW 11.6 11.5 - 15.5 %   Platelets 250 150 - 400 K/uL   nRBC 0.0 0.0 - 0.2 %    Comment: Performed at [facility], [place] [address], [place], [place] [address]  Comprehensive metabolic panel     Status: Abnormal   Collection Time: 11/08/20  2:07 AM  Result Value Ref Range   Sodium 140 135 - 145 mmol/L   Potassium 3.9 3.5 - 5.1 mmol/L   Chloride 104 98 - 111 mmol/L   CO2 28 22 - 32 mmol/L   Glucose, Bld 103 (H) 70 - 99  mg/dL    Comment: Glucose reference range applies only to samples taken after fasting for at least 8 hours.   BUN 10 6 - 20 mg/dL   Creatinine, Ser 0.79 0.61 - 1.24 mg/dL   Calcium 8.9 8.9 - 10.3 mg/dL   Total Protein 5.8 (L) 6.5 - 8.1 g/dL   Albumin 2.7 (L) 3.5 - 5.0 g/dL   AST 79 (H) 15 - 41 U/L   ALT 52 (H) 0 - 44 U/L   Alkaline Phosphatase 94 38 - 126 U/L   Total Bilirubin 0.4 0.3 - 1.2 mg/dL   GFR, Estimated >60 >60 mL/min    Comment: (NOTE) Calculated using the CKD-EPI Creatinine Equation (2021)    Anion gap 8 5 - 15    Comment: Performed at [facility], [place] [address], [place], [place] [address]  CBC     Status: Abnormal   Collection Time: 11/09/20  4:39 AM  Result Value Ref Range   WBC 6.3 4.0 - 10.5 K/uL   RBC 3.72 (L) 4.22 - 5.81 MIL/uL   Hemoglobin 11.9 (L) 13.0 - 17.0 g/dL   HCT 36.5 (L) 39.0 - 52.0 %   MCV 98.1 80.0 - 100.0 fL   MCH 32.0 26.0 - 34.0 pg   MCHC 32.6 30.0 - 36.0 g/dL   RDW 11.5 11.5 - 15.5 %   Platelets 231 150 - 400 K/uL   nRBC 0.0 0.0 - 0.2 %    Comment: Performed at [facility], [place] [address], [place], [place] [address]  Comprehensive metabolic panel     Status: Abnormal   Collection Time: 11/09/20  4:39 AM  Result Value Ref Range   Sodium 142 135 - 145 mmol/L   Potassium 3.8 3.5 - 5.1 mmol/L   Chloride 103 98 - 111 mmol/L   CO2 30 22 - 32 mmol/L   Glucose, Bld 95 70 - 99 mg/dL    Comment: Glucose reference range applies only to samples taken after fasting for at least 8 hours.   BUN 8 6 - 20 mg/dL    Creatinine, Ser 0.74 0.61 - 1.24 mg/dL   Calcium 9.2 8.9 - 10.3 mg/dL   Total Protein 6.6 6.5 - 8.1 g/dL   Albumin 2.9 (L) 3.5 - 5.0 g/dL   AST 46 (H) 15 - 41 U/L   ALT 47 (H) 0 - 44 U/L   Alkaline Phosphatase 88 38 - 126 U/L   Total Bilirubin 0.7 0.3 - 1.2 mg/dL   GFR, Estimated >60 >60 mL/min    Comment: (NOTE) Calculated using the CKD-EPI Creatinine Equation (2021)    Anion gap 9 5 - 15    Comment: Performed at [facility], [place] [address] [place], [place] [address]    Current Facility-Administered Medications  Medication Dose Route Frequency Provider Last Rate Last Admin  . acetaminophen (TYLENOL) tablet 650 mg  650 mg Oral Q6H PRN Chotiner, Yevonne Aline, MD   650 mg at 11/08/20 1052   Or  . acetaminophen (TYLENOL) suppository 650 mg  650 mg Rectal Q6H PRN Chotiner, Yevonne Aline, MD      . enoxaparin (LOVENOX) injection 40 mg  40 mg Subcutaneous Q24H Karren Cobble, RPH   40 mg at 11/08/20 1100  . haloperidol lactate (HALDOL) injection 2.5 mg  2.5 mg Intravenous Q6H PRN Damita Dunnings B, MD   2.5 mg at 11/09/20 0805   Or  . haloperidol lactate (HALDOL) injection 5 mg  5 mg  Intramuscular Q6H PRN Freida Busman, MD   5 mg at 11/07/20 1639  . lactated ringers infusion   Intravenous Continuous Little Ishikawa, MD 50 mL/hr at 11/09/20 0803 New Bag at 11/09/20 0803  . levETIRAcetam (KEPPRA) IVPB 500 mg/100 mL premix  500 mg Intravenous Q12H Chotiner, Yevonne Aline, MD 400 mL/hr at 11/09/20 0815 500 mg at 11/09/20 0815  . LORazepam (ATIVAN) injection 2 mg  2 mg Intravenous Q6H PRN Damita Dunnings B, MD   2 mg at 11/09/20 0805   Or  . LORazepam (ATIVAN) injection 2 mg  2 mg Intramuscular Q6H PRN Damita Dunnings B, MD      . OLANZapine zydis (ZYPREXA) disintegrating tablet 5 mg  5 mg Oral Daily Ival Bible, MD   5 mg at 11/09/20 0950   And  . OLANZapine zydis (ZYPREXA) disintegrating tablet 10 mg  10 mg Oral QHS Ival Bible, MD   10 mg at 11/08/20 2127  .  ondansetron (ZOFRAN) tablet 4 mg  4 mg Oral Q6H PRN Chotiner, Yevonne Aline, MD       Or  . ondansetron (ZOFRAN) injection 4 mg  4 mg Intravenous Q6H PRN Chotiner, Yevonne Aline, MD        Musculoskeletal: Strength & Muscle Tone: within normal limits Gait & Station: remains in bed Patient leans: N/A            Psychiatric Specialty Exam:  Presentation  General Appearance: Appropriate for Environment  Eye Contact:Minimal  Speech:Slow  Speech Volume:Decreased  Handedness:No data recorded  Mood and Affect  Mood:Anxious  Affect:Flat   Thought Process  Thought Processes:Disorganized  Descriptions of Associations:Loose  Orientation:-- (intact to person and knows he is in a hospital but thinks he is still in [place], patient is not intact to month or year)  Thought Content:Illogical; Scattered  History of Schizophrenia/Schizoaffective disorder:No data recorded Duration of Psychotic Symptoms:No data recorded Hallucinations:Hallucinations: Auditory Description of Auditory Hallucinations: the voices tell him to do things  Ideas of Reference:Delusions  Suicidal Thoughts:Suicidal Thoughts: No  Homicidal Thoughts:Homicidal Thoughts: No   Sensorium  Memory:Immediate Poor; Recent Poor; Remote Poor  Judgment:Impaired  Insight:None   Executive Functions  Concentration:Poor  Attention Span:Poor  Recall:Poor  Fund of Knowledge:Poor  Language:Fair   Psychomotor Activity  Psychomotor Activity:Psychomotor Activity: Psychomotor Retardation   Assets  Assets:Resilience; Social Support   Sleep  Sleep:Sleep: Poor   Physical Exam: Physical Exam HENT:     Head: Normocephalic and atraumatic.  Eyes:     Extraocular Movements: Extraocular movements intact.     Conjunctiva/sclera: Conjunctivae normal.  Cardiovascular:     Rate and Rhythm: Normal rate.  Pulmonary:     Effort: Pulmonary effort is normal.     Breath sounds: Normal breath sounds.  Abdominal:      General: Abdomen is flat.  Musculoskeletal:        General: Normal range of motion.  Skin:    General: Skin is warm and dry.     Comments: Wrist restraints on, nor sores around restraints Mittens removed while being assessed  Neurological:     General: No focal deficit present.     Mental Status: He is alert. He is disoriented.    Review of Systems  Constitutional: Negative for chills and fever.  HENT: Negative for hearing loss.   Eyes: Negative for blurred vision.  Respiratory: Negative for cough and wheezing.   Cardiovascular: Negative for chest pain.  Gastrointestinal: Negative for abdominal pain.  Neurological: Negative for  dizziness and focal weakness.  Psychiatric/Behavioral: Positive for hallucinations. Negative for suicidal ideas.   Blood pressure (!) 155/93, pulse 94, temperature 98.1 F (36.7 C), temperature source Axillary, resp. rate 20, height 5\' 7"  (1.702 m), weight 51.5 kg, SpO2 98 %. Body mass index is 17.78 kg/m.  Treatment Plan Summary: Daily contact with patient to assess and evaluate symptoms and progress in treatment   Hx of schizophrenia vs schizoaffective disorder Delirium Wife reports that patient had no psych hx prior to 09/2020. His first psychotic break was in 09/2020 and led to him being hospitalized and his second was 10/2020. On call with wife today (6/6) wife reports that patient started becoming more agitated and bit paranoid 1 year ago, but she only started fearing for her safety in 09/2020.  She does report that patient smoked THC daily from age 43 y until 3 mon ago. Patient apparently stopped smoking after he became paranoid about 3 mon ago. Wife does report that patient has a brother with schizophrenia. It is uncommon for individuals to be diagnosed with schizophrenia spectrum disorders for the first time in their later 49s; unclear veracity of diagnosis as could be related to possible delirium that has been long standing in the setting of lung  neoplasm and possible contributing effects of marijuana use.  Patient continues to appear confused today and per charting patient's cognitive status is waxing and waning more representative of Delirium. However possible etiology for delirium remains unknown. However differential includes possible Lung neoplasm that may be secretory. Delirium is further supported by the evidence that patient received Ativan alone last night and he does appear more confused on assessment this AM than yesterday. Provider also spoke with RN to make sure that patient is getting Ativan w/ Haldol when necessary to prevent benzo use worsening delirium.   - Continue  zyprexa zydis 5 mg daily and 10 mg qhs - Haldol 5mg  IM or 2.5mg  IV and Ativan 2 mg IM or IV for agitation protocol -recommend that patient not receive ativan alone for agitation and that it can be given in conjunction with haldol as benzos can worsen delirium - If patient needs imaging and is uncooperative can give Ativan alone for sedation prior. Patient handoff from [facility] indicated patient received MRI; however [facility] note in EMR indicates patient received CT head.   Recommendations - Continue sitter as primary team deems necessary - Trial without wrist restraints as patient appears to be more calm - Consider consult to Oncology or Neurology as patient's reported psychosis is recent onset and occurred around the time patient was determined to have Lung cancer.  Disposition: Patient not currently medically stable. Remains on floor.  Psychiatry will continue to follow. Patient does not meet inpatient psych criteria for admission at this time.   PGY-1 Freida Busman, MD 11/09/2020 10:22 AM

## 2020-11-10 DIAGNOSIS — G9340 Encephalopathy, unspecified: Secondary | ICD-10-CM

## 2020-11-10 LAB — CBC
HCT: 35 % — ABNORMAL LOW (ref 39.0–52.0)
Hemoglobin: 11.5 g/dL — ABNORMAL LOW (ref 13.0–17.0)
MCH: 32.2 pg (ref 26.0–34.0)
MCHC: 32.9 g/dL (ref 30.0–36.0)
MCV: 98 fL (ref 80.0–100.0)
Platelets: 226 10*3/uL (ref 150–400)
RBC: 3.57 MIL/uL — ABNORMAL LOW (ref 4.22–5.81)
RDW: 11.6 % (ref 11.5–15.5)
WBC: 5.7 10*3/uL (ref 4.0–10.5)
nRBC: 0 % (ref 0.0–0.2)

## 2020-11-10 LAB — COMPREHENSIVE METABOLIC PANEL
ALT: 35 U/L (ref 0–44)
AST: 25 U/L (ref 15–41)
Albumin: 2.9 g/dL — ABNORMAL LOW (ref 3.5–5.0)
Alkaline Phosphatase: 83 U/L (ref 38–126)
Anion gap: 9 (ref 5–15)
BUN: 12 mg/dL (ref 6–20)
CO2: 26 mmol/L (ref 22–32)
Calcium: 9.1 mg/dL (ref 8.9–10.3)
Chloride: 105 mmol/L (ref 98–111)
Creatinine, Ser: 0.68 mg/dL (ref 0.61–1.24)
GFR, Estimated: >60 mL/min (ref >60)
Glucose, Bld: 100 mg/dL — ABNORMAL HIGH (ref 70–99)
Potassium: 3.6 mmol/L (ref 3.5–5.1)
Sodium: 140 mmol/L (ref 135–145)
Total Bilirubin: 0.1 mg/dL — ABNORMAL LOW (ref 0.3–1.2)
Total Protein: 6.1 g/dL — ABNORMAL LOW (ref 6.5–8.1)

## 2020-11-10 NOTE — Consult Note (Signed)
Psych asked by Primary team to assess for capacity.  On reassessment patient did not appear to have capacity as he does not understand his current diagnosis and what would be the consequence of refusing treatment or the benefits of treatment. Dr. Lake Bells from [facility] was also in the room and agreed patient does not appear to have capacity at this time.   Patient would benefit from having someone assist him in making decisions regarding his care.   Capacity is fluctuating and as there is concern for encephalopahty in the patient it is likely that patient's capacity is fluctuating. Recommend reassessing patient when he is more stable.  Psychiatry will remained signed off.  PGY-1  Damita Dunnings, MD

## 2020-11-10 NOTE — Consult Note (Signed)
[facility] Face-to-Face Psychiatry Consult   Reason for Consult:  Erratic behavior, AMS Referring Physician:   Holli Humbles, MD Patient Identification: Benjamin [name] MRN:  [id number] Principal Diagnosis: Encephalopathy acute Diagnosis:  Principal Problem:   Encephalopathy acute Active Problems:   Seizure disorder ([place])   Lung neoplasm   Other fracture of left great toe, initial encounter for closed fracture   Encephalopathy   Total Time spent with patient: 15 minutes  Subjective:   Benjamin [name] is a 55 y.o. male patient admitted with medical history significant forseizure disorder who presented to [facility] emergency room with altered mental status . Per EMR patient also has a PPH of schizophrenia vs schizoaffective disorder, per wife patient did not have OP psych. Patient was recently diagnosed with Lung cancer 10/2020.  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  Oncology noted patient was agitated and had gotten out of bed and pulled his IV. Patient was placed on waist restraint and had his hand gloves replaced. Patient remained compliant with his medications.   On assessment this AM patient appeared to be improved. Patient was not endorsing AVH and patient sitter reported patient has had no behavior issues. Patient did wet the bed once but had otherwise remained very calm and mostly sleeping. Patient continues to believe that it is May 2023 and that he is in [facility].  Patient did not endorse SI nor HI this AM. Patient was not perseverating or endorsing further delusions this AM.   Past Psychiatric Hx:  Wife reports that patient had no psych hx prior to 09/2020. His fisrt psychotic break was in 09/2020 and led to him being hospitalized and his second was 10/2020. Wife reports that he has never gone nights without sleep. She does report that patient smoked THC daily from age 37  y until 3 mon ago. Patient apparently stopped smoking after he became paranoid about 3 mon ago. Wife does report that patient has a brother with schizophrenia. Risk to Self:  NO Risk to Others:  NO, but wife reports he has been risk to her at home when he becomes paranoid over the last 2 months. Prior Inpatient Therapy:   Yes [facility] 09/2020 and 10/2020 Prior Outpatient Therapy:  No per wife  Past Medical History:  Past Medical History:  Diagnosis Date  . Anxiety   . ED (erectile dysfunction)   . Headache 06/01/2017  . Seizure disorder ([place]) 06/01/2017  . TBI (traumatic brain injury) ([place])    1986    Past Surgical History:  Procedure Laterality Date  . colo-vesicular fistula     repair  . HEMORROIDECTOMY    . reconstructive surgery     right mandibular   Family History:  Family History  Problem Relation Age of Onset  . Hypercholesterolemia Mother   . Hypertension Mother   . Hypertension Father   . Heart attack Father   . Schizophrenia Brother   . Seizures Neg Hx    Family Psychiatric  History: Per above Social History:  Social History   Substance and Sexual Activity  Alcohol Use No     Social History   Substance and Sexual Activity  Drug Use No    Social History   Socioeconomic History  . Marital status: Married    Spouse name: Not on file  . Number of children: Not on file  . Years of education: Not on file  .  Highest education level: Not on file  Occupational History  . Not on file  Tobacco Use  . Smoking status: Current Every Day Smoker    Packs/day: 1.00    Types: Cigarettes  . Smokeless tobacco: Never Used  Substance and Sexual Activity  . Alcohol use: No  . Drug use: No  . Sexual activity: Not on file  Other Topics Concern  . Not on file  Social History Narrative  . Not on file   Social Determinants of Health   Financial Resource Strain: Not on file  Food Insecurity: Not on file  Transportation Needs: Not on file  Physical Activity:  Not on file  Stress: Not on file  Social Connections: Not on file   Additional Social History:    Allergies:   Allergies  Allergen Reactions  . Peanut-Containing Drug Products     Diverticulitis     Labs:  Results for orders placed or performed during the hospital encounter of 11/04/20 (from the past 48 hour(s))  CBC     Status: Abnormal   Collection Time: 11/09/20  4:39 AM  Result Value Ref Range   WBC 6.3 4.0 - 10.5 K/uL   RBC 3.72 (L) 4.22 - 5.81 MIL/uL   Hemoglobin 11.9 (L) 13.0 - 17.0 g/dL   HCT 36.5 (L) 39.0 - 52.0 %   MCV 98.1 80.0 - 100.0 fL   MCH 32.0 26.0 - 34.0 pg   MCHC 32.6 30.0 - 36.0 g/dL   RDW 11.5 11.5 - 15.5 %   Platelets 231 150 - 400 K/uL   nRBC 0.0 0.0 - 0.2 %    Comment: Performed at [facility], [place] [address], [place], [place] [address]  Comprehensive metabolic panel     Status: Abnormal   Collection Time: 11/09/20  4:39 AM  Result Value Ref Range   Sodium 142 135 - 145 mmol/L   Potassium 3.8 3.5 - 5.1 mmol/L   Chloride 103 98 - 111 mmol/L   CO2 30 22 - 32 mmol/L   Glucose, Bld 95 70 - 99 mg/dL    Comment: Glucose reference range applies only to samples taken after fasting for at least 8 hours.   BUN 8 6 - 20 mg/dL   Creatinine, Ser 0.74 0.61 - 1.24 mg/dL   Calcium 9.2 8.9 - 10.3 mg/dL   Total Protein 6.6 6.5 - 8.1 g/dL   Albumin 2.9 (L) 3.5 - 5.0 g/dL   AST 46 (H) 15 - 41 U/L   ALT 47 (H) 0 - 44 U/L   Alkaline Phosphatase 88 38 - 126 U/L   Total Bilirubin 0.7 0.3 - 1.2 mg/dL   GFR, Estimated >60 >60 mL/min    Comment: (NOTE) Calculated using the CKD-EPI Creatinine Equation (2021)    Anion gap 9 5 - 15    Comment: Performed at [facility], [place] [address], [place], [place] [address]  CBC     Status: Abnormal   Collection Time: 11/10/20  4:38 AM  Result Value Ref Range   WBC 5.7 4.0 - 10.5 K/uL   RBC 3.57 (L) 4.22 - 5.81 MIL/uL   Hemoglobin 11.5 (L) 13.0 - 17.0 g/dL   HCT 35.0 (L) 39.0 - 52.0 %   MCV 98.0 80.0 - 100.0 fL    MCH 32.2 26.0 - 34.0 pg   MCHC 32.9 30.0 - 36.0 g/dL   RDW 11.6 11.5 - 15.5 %   Platelets 226 150 - 400 K/uL  nRBC 0.0 0.0 - 0.2 %    Comment: Performed at [facility], [place] [address] [place], [place] [address]  Comprehensive metabolic panel     Status: Abnormal   Collection Time: 11/10/20  4:38 AM  Result Value Ref Range   Sodium 140 135 - 145 mmol/L   Potassium 3.6 3.5 - 5.1 mmol/L   Chloride 105 98 - 111 mmol/L   CO2 26 22 - 32 mmol/L   Glucose, Bld 100 (H) 70 - 99 mg/dL    Comment: Glucose reference range applies only to samples taken after fasting for at least 8 hours.   BUN 12 6 - 20 mg/dL   Creatinine, Ser 0.68 0.61 - 1.24 mg/dL   Calcium 9.1 8.9 - 10.3 mg/dL   Total Protein 6.1 (L) 6.5 - 8.1 g/dL   Albumin 2.9 (L) 3.5 - 5.0 g/dL   AST 25 15 - 41 U/L   ALT 35 0 - 44 U/L   Alkaline Phosphatase 83 38 - 126 U/L   Total Bilirubin 0.1 (L) 0.3 - 1.2 mg/dL   GFR, Estimated >60 >60 mL/min    Comment: (NOTE) Calculated using the CKD-EPI Creatinine Equation (2021)    Anion gap 9 5 - 15    Comment: Performed at [facility], [place] [address], [place], [place] [address]    Current Facility-Administered Medications  Medication Dose Route Frequency Provider Last Rate Last Admin  . acetaminophen (TYLENOL) tablet 650 mg  650 mg Oral Q6H PRN Chotiner, Yevonne Aline, MD   650 mg at 11/08/20 1052   Or  . acetaminophen (TYLENOL) suppository 650 mg  650 mg Rectal Q6H PRN Chotiner, Yevonne Aline, MD      . enoxaparin (LOVENOX) injection 40 mg  40 mg Subcutaneous Q24H Karren Cobble, RPH   40 mg at 11/10/20 0945  . haloperidol lactate (HALDOL) injection 2.5 mg  2.5 mg Intravenous Q6H PRN Damita Dunnings B, MD   2.5 mg at 11/10/20 1010   Or  . haloperidol lactate (HALDOL) injection 5 mg  5 mg Intramuscular Q6H PRN Damita Dunnings B, MD   5 mg at 11/07/20 1639  . lactated ringers infusion   Intravenous Continuous Little Ishikawa, MD 50 mL/hr at 11/09/20 0803 New Bag at 11/09/20  0803  . levETIRAcetam (KEPPRA) IVPB 500 mg/100 mL premix  500 mg Intravenous Q12H Chotiner, Yevonne Aline, MD 400 mL/hr at 11/10/20 0932 500 mg at 11/10/20 0932  . LORazepam (ATIVAN) injection 2 mg  2 mg Intravenous Q6H PRN Damita Dunnings B, MD   2 mg at 11/10/20 1014   Or  . LORazepam (ATIVAN) injection 2 mg  2 mg Intramuscular Q6H PRN Damita Dunnings B, MD      . OLANZapine zydis (ZYPREXA) disintegrating tablet 5 mg  5 mg Oral Daily Ival Bible, MD   5 mg at 11/10/20 0933   And  . OLANZapine zydis (ZYPREXA) disintegrating tablet 10 mg  10 mg Oral QHS Ival Bible, MD   10 mg at 11/09/20 2335  . ondansetron (ZOFRAN) tablet 4 mg  4 mg Oral Q6H PRN Chotiner, Yevonne Aline, MD       Or  . ondansetron (ZOFRAN) injection 4 mg  4 mg Intravenous Q6H PRN Chotiner, Yevonne Aline, MD        Musculoskeletal: Strength & Muscle Tone: within normal limits Gait & Station: remains in bed, defer Patient leans: N/A  Psychiatric Specialty Exam:  Presentation  General Appearance: Appropriate for Environment  Eye Contact:Fair  Speech:Slow  Speech Volume:Decreased  Handedness:No data recorded  Mood and Affect  Mood:Euthymic  Affect:Flat   Thought Process  Thought Processes:Goal Directed  Descriptions of Associations:Circumstantial  Orientation:-- (aware he is in the hospital, says year is May 2023)  Thought Content:-- ([place])  History of Schizophrenia/Schizoaffective disorder:No data recorded Duration of Psychotic Symptoms:No data recorded Hallucinations:Hallucinations: None Description of Auditory Hallucinations: the voices tell him to do things  Ideas of Reference:None  Suicidal Thoughts:Suicidal Thoughts: No  Homicidal Thoughts:Homicidal Thoughts: No   Sensorium  Memory:Immediate Poor; Recent Poor; Remote Poor  Judgment:Impaired  Insight:None   Executive Functions  Concentration:Poor  Attention Span:Poor  Recall:Poor  Fund of  Knowledge:Poor  Language:Fair   Psychomotor Activity  Psychomotor Activity:Psychomotor Activity: Psychomotor Retardation   Assets  Assets:Social Support   Sleep  Sleep:Sleep: Fair   Physical Exam: Physical Exam HENT:     Head: Normocephalic and atraumatic.  Eyes:     Extraocular Movements: Extraocular movements intact.     Conjunctiva/sclera: Conjunctivae normal.  Cardiovascular:     Rate and Rhythm: Normal rate.  Pulmonary:     Effort: Pulmonary effort is normal.     Breath sounds: Normal breath sounds.  Abdominal:     General: Abdomen is flat.  Musculoskeletal:        General: Normal range of motion.  Skin:    General: Skin is warm and dry.  Neurological:     Mental Status: He is alert. He is disoriented.    Review of Systems  Constitutional: Negative for chills and fever.  HENT: Negative for hearing loss.   Eyes: Negative for blurred vision.  Respiratory: Negative for cough and wheezing.   Cardiovascular: Negative for chest pain.  Gastrointestinal: Negative for abdominal pain.  Neurological: Negative for dizziness.  Psychiatric/Behavioral: Negative for suicidal ideas.   Blood pressure 137/89, pulse 80, temperature 98.2 F (36.8 C), temperature source Oral, resp. rate 15, height 5\' 7"  (1.702 m), weight 51.5 kg, SpO2 100 %. Body mass index is 17.78 kg/m.  Treatment Plan Summary: Continue per primary team.   Hx of schizophrenia vs schizoaffective disorder Delirium Wife reports that patient had no psych hx prior to 09/2020. His first psychotic break was in 09/2020 and led to him being hospitalized and his second was 10/2020. On call with wife today (6/6) wife reports that patient started becoming more agitated and bit paranoid 1 year ago, but she only started fearing for her safety in 09/2020.  She does report that patient smoked THC daily from age 30 y until 3 mon ago. Patient apparently stopped smoking after he became paranoid about 3 mon ago. Wife does report  that patient has a brother with schizophrenia. It is uncommon for individuals to be diagnosed with schizophrenia spectrum disorders for the first time in their later 28s; unclear veracity of diagnosis as could be related to possible delirium that has been long standing in the setting of lung neoplasm and possible contributing effects of marijuana use.  Oncology is concerned for encephalopathy and at this time this differential resembles patient's presentation and is more likely than a rare late diagnosis of schizophrenia. Patient's behavior changes wax and wane rather than remain consistent as seen in truly psychotic patients. Antipsychotic medications may help to treat some of patient's symptoms but may not be treating the actual underlying problem.  -Continue zyprexa zydis 5 mg daily and 10 mg qhs - Haldol 5mg  IM  or 2.5mg  IV and Ativan 2 mg IM or IV for agitation protocol -recommend that patient not receive ativan alone for agitation and that it can be given in conjunction with haldol as benzos can worsen delirium - If patient needs imaging and is uncooperative can give Ativan alone for sedation prior. Patient handoff from [facility] indicated patient received MRI; however [facility]/ON[facility] note in EMR indicates patient received CT head. - Agree with Oncology's reccomendations  Recommendations - Continue sitter as primary team deems necessary - Trial without restraints when patient is more stable  Disposition: Psychiatry will follow from [place].    Patient requires further medical workup at this time. Psychiatry will sign off. Please do not hesitate to call back if questions arise. Thank you for this consult.   PGY-1 Freida Busman, MD 11/10/2020 10:21 AM

## 2020-11-10 NOTE — Progress Notes (Signed)
Patient ripped condom cath off and peed all in the bed. Patient also keeps taking his hand mitts off.

## 2020-11-10 NOTE — Progress Notes (Addendum)
PROGRESS NOTE    Benjamin [name]  JJH:[id number] DOB: 1966/03/22 DOA: 11/04/2020 PCP: Glenda Chroman, MD   Brief Narrative:  Benjamin [name] is a 55 y.o. male with medical history significant for seizure disorder who presented to [facility] emergency room with altered mental status.  His wife reported that he had been acting very erratically.  He was complaining of pain in his left foot.  Reportedly he had an injury to his left foot at home in the last day or 2 and has had pain in the foot since then.  He was found to have fracture of his left first toe.  Placed in a postop shoe in the emergency room.  He had an erratic behavior and was very anxious and uncooperative and he was dosed with Haldol Ativan and Benadryl in the emergency room.  Chest x-ray revealed a lingular pneumonia versus a central lung mass.  CT of his chest was obtained which showed a mass in the left lingula and surrounding postobstructive pneumonitis.  The mass extended into the left hilum and he had left hilar adenopathy.  He also had a mass in the right upper lobe.  MRI was obtained to make sure there is no metastasis that would be causing his symptoms.  MRI did not reveal any intracranial lesions.  Due to lack of specialists at [facility] for further work-up so patient was transferred to [facility].  Imaging at outside facility CT chest 11/04/2020 1. There is an apparent mass arising in the lingula with suspected surrounding postobstructive pneumonitis. This mass extends into the left hilum with apparent left hilar adenopathy which is difficult to delineate from the dominant lingular mass. Advise consideration for Pulmonary Medicine consultation for potential bronchoscopy to obtain tissue for further assessment. 2. Small areas of opacity in the right upper lobe, likely foci of neoplasm with adjacent scarring. Nuclear medicine PET study could be helpful for further characterization of the smaller lesions. 3. Areas of  apparent adenopathy in the subcarinal and right hilar regions as well as the larger apparent adenopathy in the left hilar region. 4. Aortic atherosclerosis. There are foci great vessel and coronary artery calcification.  CTA 10/27/2020 IMPRESSION: 1. No acute pulmonary emboli. 2. Unchanged large left hilar/perihilar cavitary necrotic masslike consolidation in left upper lobe. Other cavitary pulmonary nodules bilaterally. Left hilar lymphadenopathy. Findings highly suspicious for malignancy. 3. Gastric wall thickening may represent gastritis or other etiologies. Follow-up recommended.  Assessment & Plan:   Principal Problem:   Encephalopathy acute Active Problems:   Seizure disorder (H[facility])   Lung neoplasm   Other fracture of left great toe, initial encounter for closed fracture   Encephalopathy  Acute versus subacute encephalopathy vs acute psychotic break - unclear etiology, POA Questionable diagnosis of schizophrenia versus schizoaffective disorder Rule out paraneoplastic limbic encephalopathy -Patient clinically improving this morning alert and oriented to at least person place and year -no acute issues or events overnight although did require extra dose of sedating medication this morning due to worsening agitation -Psychiatry following, we appreciate insight recommendations  - continue to up titrate patient's Zyprexa over the past 48 hours with improving clinical status  -recommending as needed Ativan and Haldol as well as further work-up of lung mass as below  -Patient remains markedly combative, continues to require Ativan and Haldol multiple times per day to maintain safety at this point -wife continues to voice concern that patient has combative and she fears for her safety at home should he be discharged -History  of seizure with medication noncompliance, EEG negative  -MRI brain unremarkable -Unlikely withdrawal given negative labs and patient well outside window at this time for  withdrawals  Incidental lung nodule/opacification/mass, POA - On routine imaging CT of chest showed to have nodules concerning for cancer  - Heme-onc following to rule out paraneoplastic limbic encephalopathy given above - Pulmonology involved for further assessment on biopsy approach  Seizure disorder -Cannot rule out acute breakthrough seizure in the setting of noncompliance, EEG negative -Continue Keppra IV per neurology  Other fracture of left great toe, initial encounter for closed fracture Post op shoe was placed in the emergency room and will be continued.  No further indication for imaging or intervention at this time. Pain well controlled.  DVT prophylaxis:  Lovenox Code Status:  Full code  Family Communication: Wife updated over phone  Status is: Inpatient  Dispo: The patient is from: Home              Anticipated d/c is to: To be determined              Anticipated d/c date is: >72 hours              Patient currently not medically stable for discharge  Consultants:   None  Procedures:   None  Antimicrobials:  None indicated  Subjective: Patient slept better overnight only requiring 1 dose of as needed anxiolytic, this morning he required a second dose and is somewhat somnolent for interview but is oriented to person place and year for the first time since admission.  Review of systems is otherwise somewhat unreliable given patient's noncompliance with interview, verbally abusive but remains in restraints at this point due to behavior.  Objective: Vitals:   11/09/20 0505 11/09/20 1233 11/09/20 2120 11/10/20 0434  BP: (!) 155/93 137/87 (!) 170/90 125/68  Pulse: 94 78 73 80  Resp: 20 13 17 13   Temp: 98.1 F (36.7 C) 97.7 F (36.5 C) 98.4 F (36.9 C) 97.9 F (36.6 C)  TempSrc: Axillary Oral Oral Axillary  SpO2: 98% 98% 96% 100%  Weight:      Height:        Intake/Output Summary (Last 24 hours) at 11/10/2020 0656 Last data filed at 11/10/2020 0434 Gross  per 24 hour  Intake 180 ml  Output 1250 ml  Net -1070 ml   Filed Weights   11/04/20 2343  Weight: 51.5 kg    Examination:  General:  Pleasantly resting in bed, No acute distress.  Cachectic appearing HEENT:  Normocephalic atraumatic.  Sclerae nonicteric, noninjected.  Extraocular movements intact bilaterally. Neck:  Without mass or deformity.  Trachea is midline. Lungs:  Clear to auscultate bilaterally without rhonchi, wheeze, or rales. Heart:  Regular rate and rhythm.  Without murmurs, rubs, or gallops. Abdomen:  Soft, nontender, nondistended.  Without guarding or rebound. Extremities: Without cyanosis, clubbing, edema, or obvious deformity. Vascular:  Dorsalis pedis and posterior tibial pulses palpable bilaterally. Skin:  Warm and dry, no erythema, no ulcerations. Neuro/psych: Oriented to person year and place although does not understand why we are in the hospital he states he thinks it is due to some foot injury.  Continues to have poor understanding of medical care, current condition and poor capacity to further understand or make appropriate medical decisions  Data Reviewed: I have personally reviewed following labs and imaging studies  CBC: Recent Labs  Lab 11/05/20 0221 11/07/20 0245 11/08/20 0207 11/09/20 0439 11/10/20 0438  WBC 5.3 7.0 6.8 6.3  5.7  HGB 10.7* 12.1* 11.4* 11.9* 11.5*  HCT 33.0* 35.9* 34.6* 36.5* 35.0*  MCV 99.4 97.6 98.3 98.1 98.0  PLT 248 270 250 231 759   Basic Metabolic Panel: Recent Labs  Lab 11/05/20 0221 11/07/20 0245 11/08/20 0207 11/09/20 0439 11/10/20 0438  NA 140 139 140 142 140  K 3.4* 3.4* 3.9 3.8 3.6  CL 106 102 104 103 105  CO2 27 25 28 30 26   GLUCOSE 104* 114* 103* 95 100*  BUN 18 12 10 8 12   CREATININE 1.00 0.78 0.79 0.74 0.68  CALCIUM 8.4* 9.1 8.9 9.2 9.1   GFR: Estimated Creatinine Clearance: 74.2 mL/min (by C-G formula based on SCr of 0.68 mg/dL). Liver Function Tests: Recent Labs  Lab 11/05/20 0221  11/07/20 0245 11/08/20 0207 11/09/20 0439 11/10/20 0438  AST 15 20 79* 46* 25  ALT 13 19 52* 47* 35  ALKPHOS 70 84 94 88 83  BILITOT 0.3 0.7 0.4 0.7 0.1*  PROT 5.9* 6.6 5.8* 6.6 6.1*  ALBUMIN 2.8* 3.0* 2.7* 2.9* 2.9*   No results for input(s): LIPASE, AMYLASE in the last 168 hours. No results for input(s): AMMONIA in the last 168 hours. Coagulation Profile: No results for input(s): INR, PROTIME in the last 168 hours. Cardiac Enzymes: No results for input(s): CKTOTAL, CKMB, CKMBINDEX, TROPONINI in the last 168 hours. BNP (last 3 results) No results for input(s): PROBNP in the last 8760 hours. HbA1C: No results for input(s): HGBA1C in the last 72 hours. CBG: No results for input(s): GLUCAP in the last 168 hours. Lipid Profile: No results for input(s): CHOL, HDL, LDLCALC, TRIG, CHOLHDL, LDLDIRECT in the last 72 hours. Thyroid Function Tests: No results for input(s): TSH, T4TOTAL, FREET4, T3FREE, THYROIDAB in the last 72 hours. Anemia Panel: No results for input(s): VITAMINB12, FOLATE, FERRITIN, TIBC, IRON, RETICCTPCT in the last 72 hours. Sepsis Labs: No results for input(s): PROCALCITON, LATICACIDVEN in the last 168 hours.  No results found for this or any previous visit (from the past 240 hour(s)).   Radiology Studies: No results found.  Scheduled Meds: . enoxaparin (LOVENOX) injection  40 mg Subcutaneous Q24H  . OLANZapine zydis  5 mg Oral Daily   And  . OLANZapine zydis  10 mg Oral QHS   Continuous Infusions: . lactated ringers 50 mL/hr at 11/09/20 0803  . levETIRAcetam 500 mg (11/09/20 2334)     LOS: 6 days   Time spent: 81min  Breindel Collier C Alyan Hartline, DO [facility]  If 7PM-7AM, please contact night-coverage [email or web address]  11/10/2020, 6:56 AM

## 2020-11-10 NOTE — Plan of Care (Signed)
  Problem: Clinical Measurements: Goal: Respiratory complications will improve Outcome: Progressing   Problem: Clinical Measurements: Goal: Cardiovascular complication will be avoided Outcome: Progressing   Problem: Education: Goal: Knowledge of General Education information will improve Description: Including pain rating scale, medication(s)/side effects and non-pharmacologic comfort measures Outcome: Not Progressing  Disoriented x4
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
## 2020-11-10 NOTE — Plan of Care (Signed)

## 2020-11-10 NOTE — Consult Note (Signed)
NAME:  Benjamin [name], MRN:  [id number], DOB:  [birth year], LOS: 6 ADMISSION DATE:  11/04/2020, CONSULTATION DATE:  6/7 REFERRING MD:  Avon Gully CHIEF COMPLAINT:  Toe hurts   History of Present Illness:  This is a 55 y/o male presented to the [facility] ER complaining of toe pain after he dropped something on his left toe and broke it.  In the ER he was noted to be combative and confused.  He was admitted, had a CT chest performed which showed a lingular cavitary lung mass, some mediastinal adenopathy and cavitary lesions in the right upper lobe as well.  He was transferred to our facility for further evaluation.  PCCM consulted for consideration of bronchoscopy.    He has been combative and has required frequent doses of antipsychotics and benzodiazepines.  He is confused on my exam, though he denies chest pain, weight loss, dyspnea or hemoptysis.  He says he coughs but it is dry.  His wife says that in the past he had seizure disorder that lead to forgetfulness and confusion in the past.    History could not reliably be obtained from the patient due to his confusion.  Pertinent  Medical History  Anxiety Erectile  Headache Seizure disorder Traumatic brain injury  Significant Hospital Events: Including procedures, antibiotic start and stop dates in addition to other pertinent events   . 6/1 moved to [facility] from [place] . Psyche consult: feel his mental status changes are related to underlying malignancy . Oncology consult: feel his mental status changes are related to underlying malignancy . 6/6 PCCM consult  Interim History / Subjective:  As above  Objective   Blood pressure 137/89, pulse 80, temperature 98.2 F (36.8 C), temperature source Oral, resp. rate 15, height 5\' 7"  (1.702 m), weight 51.5 kg, SpO2 100 %.        Intake/Output Summary (Last 24 hours) at 11/10/2020 1241 Last data filed at 11/10/2020 0434 Gross per 24 hour  Intake 120 ml  Output 1250 ml  Net -1130 ml   Filed Weights    11/04/20 2343  Weight: 51.5 kg    Examination:  General:  Resting comfortably in bed HENT: NCAT OP clear PULM: CTA B, normal effort CV: RRR, no mgr GI: BS+, soft, nontender MSK: normal bulk and tone Neuro: drowsy, confused, moving all four extremities well   Labs/imaging that I havepersonally reviewed  (right click and "Reselect all SmartList Selections" daily)  Selected images from 11/2020 CT chest reviewed, see below.  The disc is in his chart.       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  Prior to that, would like to at least obtain paraneoplastic labs via CSF and serum: this will clearly require sedation and consent from his wife.  I discussed this with his wife and she tells me that while he currently doesn't consent for the procedure, she feels this is due to his psychosis.  She specifically tells me that she firmly believes that if he didn't have the mental status changes he is experiencing he would be fully supportive of a work up and treatment for cancer.   Lung mass on imaging with mediastinal adenopathy: most likely lung cancer; needs EBUS, bronchoscopy.   As I think  he is more likely to have a rapid improvement in his mental status changes with treatment of limbic encephalitis would prefer to start by working up and treating the CNS process first, then proceeding with bronchoscopy after.  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 We will need to get formal consent for LP from her  Best practice (right click and "Reselect all SmartList Selections" daily)   Per [facility]  Labs   CBC: Recent Labs  Lab 11/05/20 0221 11/07/20 0245 11/08/20 0207 11/09/20 0439 11/10/20 0438  WBC 5.3 7.0 6.8 6.3 5.7  HGB 10.7* 12.1* 11.4* 11.9* 11.5*  HCT 33.0* 35.9* 34.6* 36.5* 35.0*  MCV 99.4 97.6 98.3 98.1 98.0  PLT 248 270 250 231 381    Basic Metabolic Panel: Recent Labs  Lab 11/05/20 0221 11/07/20 0245 11/08/20 0207 11/09/20 0439 11/10/20 0438  NA 140 139 140 142 140  K 3.4* 3.4* 3.9 3.8 3.6  CL 106 102 104 103 105  CO2 27 25 28 30 26   GLUCOSE 104* 114* 103* 95 100*  BUN 18 12 10 8 12   CREATININE 1.00 0.78 0.79 0.74 0.68  CALCIUM 8.4* 9.1 8.9 9.2 9.1   GFR: Estimated Creatinine Clearance: 74.2 mL/min (by C-G formula based on SCr of 0.68 mg/dL). Recent Labs  Lab 11/07/20 0245 11/08/20 0207 11/09/20 0439 11/10/20 0438  WBC 7.0 6.8 6.3 5.7    Liver Function Tests: Recent Labs  Lab 11/05/20 0221 11/07/20 0245 11/08/20 0207 11/09/20 0439 11/10/20 0438  AST 15 20 79* 46* 25  ALT 13 19 52* 47* 35  ALKPHOS 70 84 94 88 83  BILITOT 0.3 0.7 0.4 0.7 0.1*  PROT 5.9* 6.6 5.8* 6.6 6.1*  ALBUMIN 2.8* 3.0* 2.7* 2.9* 2.9*   No results for input(s): LIPASE, AMYLASE in the last 168 hours. No results for input(s): AMMONIA in the last 168 hours.  ABG No results found for: PHART, PCO2ART, PO2ART, HCO3, TCO2, ACIDBASEDEF, O2SAT   Coagulation Profile: No results for input(s): INR, PROTIME in the last 168 hours.  Cardiac Enzymes: No results for input(s): CKTOTAL, CKMB, CKMBINDEX, TROPONINI in the last 168 hours.  HbA1C: No results found for: HGBA1C  CBG: No results for input(s): GLUCAP in the last 168 hours.  Review of Systems:   Cannot obtain due to confusion  Past Medical History:  He,  has a past medical history  of Anxiety, ED (erectile dysfunction), Headache (06/01/2017), Seizure disorder ([place]) (06/01/2017), and TBI (traumatic brain injury) ([place]).   Surgical History:   Past Surgical History:  Procedure Laterality Date  . colo-vesicular fistula     repair  . HEMORROIDECTOMY    . reconstructive surgery     right mandibular     Social History:   reports that he has been smoking cigarettes. He has been smoking about 1.00 pack per day. He has never used smokeless tobacco. He reports that he does not drink alcohol and does not use drugs.   Family History:  His family history includes Heart attack in his father; Hypercholesterolemia in his mother; Hypertension in his father and mother; Schizophrenia in his brother. There is no history of Seizures.  Allergies Allergies  Allergen Reactions  . Peanut-Containing Drug Products     Diverticulitis      Home Medications  Prior to Admission medications   Medication Sig Start Date End Date Taking? Authorizing Provider  ALPRAZolam Duanne Moron) 0.5 MG tablet Take 0.5 mg by mouth at bedtime as needed for anxiety.    [provider]  amitriptyline (ELAVIL) 25 MG tablet Take 25 mg by mouth at bedtime.    [provider]  ARIPiprazole (ABILIFY) 10 MG tablet Take 10 mg by mouth daily. 10/30/20   [provider]  cyclobenzaprine (FLEXERIL) 10 MG tablet Take 10 mg by mouth 3 (three) times daily. 10/04/20   [provider]  IBU 800 MG tablet Take 800 mg by mouth 2 (two) times daily. 10/04/20   [provider]  ketorolac (TORADOL) 10 MG tablet Take 1 tablet (10 mg total) by mouth every 8 (eight) hours as needed. 05/29/19   Suzzanne Cloud, NP  levETIRAcetam (KEPPRA) 500 MG tablet Take 1 tablet twice daily Patient taking differently: Take 500 mg by mouth 2 (two) times daily. 11/12/19   Kathrynn Ducking, MD  memantine (NAMENDA) 5 MG tablet Take 5 mg by mouth 2 (two) times daily. 10/30/20   [provider]  mirtazapine  (REMERON) 15 MG tablet Take 15 mg by mouth at bedtime. 10/30/20   [provider]  omeprazole (PRILOSEC) 40 MG capsule Take 40 mg by mouth daily.    [provider]  pantoprazole (PROTONIX) 40 MG tablet Take 40 mg by mouth daily. 10/30/20   [provider]  PHENobarbital (LUMINAL) 64.8 MG tablet Take 2 tablets (129.6 mg total) by mouth 2 (two) times daily. 01/02/19   Suzzanne Cloud, NP  sildenafil (REVATIO) 20 MG tablet Take 20 mg by mouth daily.    [provider]  topiramate (TOPAMAX) 100 MG tablet Take 1 tablet (100 mg total) by mouth 2 (two) times daily. 11/12/19   Kathrynn Ducking, MD     Critical care time: n/a, greater than 120 minutes of non-critical care time spent on this consult    Roselie Awkward, MD [place] P[facility]M Pager: [phone] Cell: [phone] If no response, please call [phone] until 7pm After 7:00 pm call Elink  [phone]

## 2020-11-11 DIAGNOSIS — G40909 Epilepsy, unspecified, not intractable, without status epilepticus: Secondary | ICD-10-CM

## 2020-11-11 DIAGNOSIS — G0481 Other encephalitis and encephalomyelitis: Secondary | ICD-10-CM

## 2020-11-11 LAB — COMPREHENSIVE METABOLIC PANEL
ALT: 30 U/L (ref 0–44)
AST: 22 U/L (ref 15–41)
Albumin: 3 g/dL — ABNORMAL LOW (ref 3.5–5.0)
Alkaline Phosphatase: 81 U/L (ref 38–126)
Anion gap: 12 (ref 5–15)
BUN: 14 mg/dL (ref 6–20)
CO2: 26 mmol/L (ref 22–32)
Calcium: 9.3 mg/dL (ref 8.9–10.3)
Chloride: 100 mmol/L (ref 98–111)
Creatinine, Ser: 0.73 mg/dL (ref 0.61–1.24)
GFR, Estimated: >60 mL/min (ref >60)
Glucose, Bld: 95 mg/dL (ref 70–99)
Potassium: 3.6 mmol/L (ref 3.5–5.1)
Sodium: 138 mmol/L (ref 135–145)
Total Bilirubin: 0.7 mg/dL (ref 0.3–1.2)
Total Protein: 6.4 g/dL — ABNORMAL LOW (ref 6.5–8.1)

## 2020-11-11 LAB — HEPATITIS PANEL, ACUTE
HCV Ab: NONREACTIVE
Hep A IgM: NONREACTIVE
Hep B C IgM: NONREACTIVE
Hepatitis B Surface Ag: NONREACTIVE

## 2020-11-11 LAB — TSH: TSH: 0.918 u[IU]/mL (ref 0.350–4.500)

## 2020-11-11 LAB — CBC
HCT: 34.5 % — ABNORMAL LOW (ref 39.0–52.0)
Hemoglobin: 11.5 g/dL — ABNORMAL LOW (ref 13.0–17.0)
MCH: 32.8 pg (ref 26.0–34.0)
MCHC: 33.3 g/dL (ref 30.0–36.0)
MCV: 98.3 fL (ref 80.0–100.0)
Platelets: 232 10*3/uL (ref 150–400)
RBC: 3.51 MIL/uL — ABNORMAL LOW (ref 4.22–5.81)
RDW: 11.6 % (ref 11.5–15.5)
WBC: 6.8 10*3/uL (ref 4.0–10.5)
nRBC: 0 % (ref 0.0–0.2)

## 2020-11-11 LAB — MISC LABCORP TEST (SEND OUT): Labcorp test code: 9985

## 2020-11-11 LAB — RPR: RPR Ser Ql: NONREACTIVE

## 2020-11-11 LAB — HIV ANTIBODY (ROUTINE TESTING W REFLEX): HIV Screen 4th Generation wRfx: NONREACTIVE

## 2020-11-11 LAB — VITAMIN B12: Vitamin B-12: 295 pg/mL (ref 180–914)

## 2020-11-11 MED ORDER — THIAMINE HCL 100 MG/ML IJ SOLN
500.0000 mg | Freq: Three times a day (TID) | INTRAVENOUS | Status: AC
  Administered 2020-11-11 – 2020-11-12 (×6): 500 mg via INTRAVENOUS
  Filled 2020-11-11 (×6): qty 5

## 2020-11-11 MED ORDER — THIAMINE HCL 100 MG/ML IJ SOLN
100.0000 mg | Freq: Every day | INTRAMUSCULAR | Status: DC
  Administered 2020-11-19: 100 mg via INTRAVENOUS
  Filled 2020-11-11: qty 2

## 2020-11-11 MED ORDER — THIAMINE HCL 100 MG/ML IJ SOLN
250.0000 mg | Freq: Every day | INTRAVENOUS | Status: DC
  Administered 2020-11-13 – 2020-11-18 (×5): 250 mg via INTRAVENOUS
  Filled 2020-11-11 (×6): qty 2.5

## 2020-11-11 NOTE — Progress Notes (Signed)
IR was requested for LP with general anesthesia.    The procedure is tentatively scheduled for tomorrow at 1pm with GA pending IR schedule.  MD notified.  Formal consult to follow.  Please call IR for questions and concerns.    Tera Mater PA-C 11/11/2020 4:37 PM

## 2020-11-11 NOTE — Progress Notes (Signed)
PROGRESS NOTE    Benjamin [name]  URK:[id number] DOB: 1965-08-20 DOA: 11/04/2020 PCP: Glenda Chroman, MD   Brief Narrative:  Benjamin [name] is a 55 y.o. male with medical history significant for seizure disorder who presented to [facility] emergency room with altered mental status.  His wife reported that he had been acting very erratically.  He was complaining of pain in his left foot.  Reportedly he had an injury to his left foot at home in the last day or 2 and has had pain in the foot since then.  He was found to have fracture of his left first toe.  Placed in a postop shoe in the emergency room.  He had an erratic behavior and was very anxious and uncooperative and he was dosed with Haldol Ativan and Benadryl in the emergency room.  Chest x-ray revealed a lingular pneumonia versus a central lung mass.  CT of his chest was obtained which showed a mass in the left lingula and surrounding postobstructive pneumonitis.  The mass extended into the left hilum and he had left hilar adenopathy.  He also had a mass in the right upper lobe.  MRI was obtained to make sure there is no metastasis that would be causing his symptoms.  MRI did not reveal any intracranial lesions.  Due to lack of specialists at [facility] for further work-up so patient was transferred to [facility]  Imaging at outside facility CT chest 11/04/2020 1. There is an apparent mass arising in the lingula with suspected surrounding postobstructive pneumonitis. This mass extends into the left hilum with apparent left hilar adenopathy which is difficult to delineate from the dominant lingular mass. Advise consideration for Pulmonary Medicine consultation for potential bronchoscopy to obtain tissue for further assessment. 2. Small areas of opacity in the right upper lobe, likely foci of neoplasm with adjacent scarring. Nuclear medicine PET study could be helpful for further characterization of the smaller lesions. 3. Areas of  apparent adenopathy in the subcarinal and right hilar regions as well as the larger apparent adenopathy in the left hilar region. 4. Aortic atherosclerosis. There are foci great vessel and coronary artery calcification.  CTA 10/27/2020 IMPRESSION: 1. No acute pulmonary emboli. 2. Unchanged large left hilar/perihilar cavitary necrotic masslike consolidation in left upper lobe. Other cavitary pulmonary nodules bilaterally. Left hilar lymphadenopathy. Findings highly suspicious for malignancy. 3. Gastric wall thickening may represent gastritis or other etiologies. Follow-up recommended.  Assessment & Plan:   Principal Problem:   Encephalopathy acute Active Problems:   Seizure disorder (HCC)   Lung neoplasm   Other fracture of left great toe, initial encounter for closed fracture   Encephalopathy  Acute versus subacute encephalopathy vs acute psychotic break - unclear etiology, POA Questionable diagnosis of schizophrenia versus schizoaffective disorder Rule out paraneoplastic limbic encephalopathy -Patient clinically improving over the past 48 hours, much more awake alert oriented to person place and year, but continues to require around-the-clock sedation and restraints however due to limited capacity, impulsive nature combativeness and potential harm to self and staff.  -Psychiatry signed off as of 11/10/2020, agrees that patient does not have capacity to make decisions as such his wife [name] will be the major decision maker for potential procedures and treatment as below -Oncology, pulmonology and neurology have been consulted in the past few days to help further evaluate patient's unspecified lung mass.  Given questionable paraneoplastic limbic encephalopathy LP has been discussed, wife [name] will need to consent for this -hoping this can be  done in the next 24 to 48 hours -LP will need CSF cell count differential, protein, glucose, oligoclonal bands, IgG index with serum and CSF  paraneoplastic panel per neurology -B12, TSH within normal limits -Currently holding Solu-Medrol and IVIG until LP to further evaluate - steroids could exacerbate patient's mental status given we cannot rule out acute psychosis as above -As needed Ativan and Haldol for combative nature, if necessary for further imaging or procedure we can increase his doses temporarily to ensure safe procedures and adequate imaging can be done as long as wife agrees. - History of seizure with medication noncompliance, EEG negative  - MRI brain unremarkable - Unlikely withdrawal given negative labs and patient well outside window at this time for withdrawals  Incidental lung nodule/opacification/mass, POA - On routine imaging CT of chest showed to have nodules concerning for cancer  - Heme-onc/pulmonology/neurology following to rule out paraneoplastic limbic encephalopathy given above -will likely initially start with LP for evaluation, bronchoscopy afterwards if necessary for definitive biopsy or diagnosis.  Seizure disorder -Cannot rule out acute breakthrough seizure in the setting of noncompliance, EEG negative and no episodes of seizure-like activity since admission -Continue Keppra IV per neurology  Other fracture of left great toe, initial encounter for closed fracture Post op shoe was placed in the emergency room and will be continued.  No further indication for imaging or intervention at this time. Pain well controlled.  DVT prophylaxis:  Lovenox Code Status:  Full code  Family Communication: Wife updated over phone  Status is: Inpatient  Dispo: The patient is from: Home              Anticipated d/c is to: To be determined              Anticipated d/c date is: >72 hours              Patient currently not medically stable for discharge  Consultants:   Psych, pulmonology, neurology, oncology  Procedures:   None -tentative plan for LP in the next 24 to 48 hours  Antimicrobials:  None  indicated  Subjective: Overnight patient continued to be agitated pulling at restraints, caused abrasion to his left wrist while attempting to pull out of restraints using his teeth.  Review of systems still moderately limited but patient denies any nausea vomiting diarrhea constipation headache fevers or chills.  She states "I want to go home" and continues to call out for people who are not in the room, unclear if he is hallucinating at this point.  Objective: Vitals:   11/10/20 0800 11/10/20 1300 11/10/20 2316 11/11/20 0544  BP:  98/65 (!) 158/85 (!) 155/88  Pulse:   98 95  Resp: 15 18 17 18   Temp:  98.2 F (36.8 C) 98.7 F (37.1 C) 97.9 F (36.6 C)  TempSrc:  Oral Oral Axillary  SpO2:   97% 93%  Weight:      Height:        Intake/Output Summary (Last 24 hours) at 11/11/2020 0705 Last data filed at 11/11/2020 0600 Gross per 24 hour  Intake 3006.94 ml  Output 1200 ml  Net 1806.94 ml   Filed Weights   11/04/20 2343  Weight: 51.5 kg    Examination:  General:  Pleasantly resting in bed calling out for people who are not currently in the room.  Cachectic appearing HEENT:  Normocephalic atraumatic.  Sclerae nonicteric, noninjected.  Extraocular movements intact bilaterally. Neck:  Without mass or deformity.  Trachea is midline.  Lungs:  Clear to auscultate bilaterally without rhonchi, wheeze, or rales. Heart:  Regular rate and rhythm.  Without murmurs, rubs, or gallops. Abdomen:  Soft, nontender, nondistended.  Without guarding or rebound. Extremities: Without cyanosis, clubbing, edema, or obvious deformity. Vascular:  Dorsalis pedis and posterior tibial pulses palpable bilaterally. Skin:  Warm and dry, no erythema, no ulcerations. Neuro/psych: Oriented to person place and general situation, does not understand why we are in the hospital, continues to call out to people not in the room - questionably hallucinating.  Continues to have poor understanding of medical care, current  condition and poor capacity to further understand or make appropriate medical decisions  Data Reviewed: I have personally reviewed following labs and imaging studies  CBC: Recent Labs  Lab 11/07/20 0245 11/08/20 0207 11/09/20 0439 11/10/20 0438 11/11/20 0345  WBC 7.0 6.8 6.3 5.7 6.8  HGB 12.1* 11.4* 11.9* 11.5* 11.5*  HCT 35.9* 34.6* 36.5* 35.0* 34.5*  MCV 97.6 98.3 98.1 98.0 98.3  PLT 270 250 231 226 614   Basic Metabolic Panel: Recent Labs  Lab 11/07/20 0245 11/08/20 0207 11/09/20 0439 11/10/20 0438 11/11/20 0345  NA 139 140 142 140 138  K 3.4* 3.9 3.8 3.6 3.6  CL 102 104 103 105 100  CO2 25 28 30 26 26   GLUCOSE 114* 103* 95 100* 95  BUN 12 10 8 12 14   CREATININE 0.78 0.79 0.74 0.68 0.73  CALCIUM 9.1 8.9 9.2 9.1 9.3   GFR: Estimated Creatinine Clearance: 74.2 mL/min (by C-G formula based on SCr of 0.73 mg/dL). Liver Function Tests: Recent Labs  Lab 11/07/20 0245 11/08/20 0207 11/09/20 0439 11/10/20 0438 11/11/20 0345  AST 20 79* 46* 25 22  ALT 19 52* 47* 35 30  ALKPHOS 84 94 88 83 81  BILITOT 0.7 0.4 0.7 0.1* 0.7  PROT 6.6 5.8* 6.6 6.1* 6.4*  ALBUMIN 3.0* 2.7* 2.9* 2.9* 3.0*   No results for input(s): LIPASE, AMYLASE in the last 168 hours. No results for input(s): AMMONIA in the last 168 hours. Coagulation Profile: No results for input(s): INR, PROTIME in the last 168 hours. Cardiac Enzymes: No results for input(s): CKTOTAL, CKMB, CKMBINDEX, TROPONINI in the last 168 hours. BNP (last 3 results) No results for input(s): PROBNP in the last 8760 hours. HbA1C: No results for input(s): HGBA1C in the last 72 hours. CBG: No results for input(s): GLUCAP in the last 168 hours. Lipid Profile: No results for input(s): CHOL, HDL, LDLCALC, TRIG, CHOLHDL, LDLDIRECT in the last 72 hours. Thyroid Function Tests: Recent Labs    11/11/20 0345  TSH 0.918   Anemia Panel: Recent Labs    11/11/20 0345  VITAMINB12 295   Sepsis Labs: No results for input(s):  PROCALCITON, LATICACIDVEN in the last 168 hours.  No results found for this or any previous visit (from the past 240 hour(s)).   Radiology Studies: No results found.  Scheduled Meds: . enoxaparin (LOVENOX) injection  40 mg Subcutaneous Q24H  . OLANZapine zydis  5 mg Oral Daily   And  . OLANZapine zydis  10 mg Oral QHS  . [START ON 11/19/2020] thiamine injection  100 mg Intravenous Daily   Continuous Infusions: . lactated ringers 50 mL/hr at 11/11/20 0222  . levETIRAcetam 500 mg (11/10/20 2139)  . thiamine injection 500 mg (11/11/20 0145)   Followed by  . [START ON 11/13/2020] thiamine injection       LOS: 7 days   Time spent: 93min  Daniell Paradise C Jaculin Rasmus, DO [facility]  If 7PM-7AM, please contact night-coverage [email or web address]  11/11/2020, 7:05 AM

## 2020-11-11 NOTE — Progress Notes (Signed)
   11/11/20 1015  Clinical Encounter Type  Visited With Patient  Visit Type Initial  Referral From Nurse  Consult/Referral To Chaplain  The chaplain visited with patient and active listening. The patient is asking for his shorts and shoes and wants to go home. The chaplain encouraged the patient to remain calm and rest well because that is important to his healing process. The patient prayed for the patient and prayed that the presence of peace. This note was prepared by Jeanine Luz, M.Div..  For questions please contact by phone [phone].
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
## 2020-11-11 NOTE — Progress Notes (Signed)
RN contacted PCCM for new abrasion concerning wrist restraints. Pt constantly trying to maneuver out of restraints, causing redness to bilateral wrist. No new orders placed regarding restraints. Mepilex applied to L wrist abrasion.

## 2020-11-11 NOTE — Progress Notes (Signed)
Patient alert, but confused and belligerent.  Combative with staff.  Removed self from restraints and proceeded to strike out at staff.  Shouting expletives and kicking out.  Medicated with Ativan and Haldol with good effect.  Refused Zyprexa medication po

## 2020-11-12 ENCOUNTER — Encounter (HOSPITAL_COMMUNITY)
Admission: AD | Disposition: A | Discharge status: Hospice | Payer: Self-pay | Source: Other Acute Inpatient Hospital | Attending: Internal Medicine

## 2020-11-12 ENCOUNTER — Inpatient Hospital Stay (HOSPITAL_COMMUNITY): Payer: Medicaid Other | Admitting: Certified Registered Nurse Anesthetist

## 2020-11-12 ENCOUNTER — Inpatient Hospital Stay (HOSPITAL_COMMUNITY): Payer: Medicaid Other

## 2020-11-12 ENCOUNTER — Encounter (HOSPITAL_COMMUNITY): Payer: Self-pay | Admitting: Family Medicine

## 2020-11-12 HISTORY — PX: RADIOLOGY WITH ANESTHESIA: SHX6223

## 2020-11-12 HISTORY — PX: IR FLUORO GUIDED NEEDLE PLC ASPIRATION/INJECTION LOC: IMG2395

## 2020-11-12 LAB — CSF CELL COUNT WITH DIFFERENTIAL
RBC Count, CSF: 36 /mm3 — ABNORMAL HIGH
RBC Count, CSF: 54 /mm3 — ABNORMAL HIGH
Tube #: 1
Tube #: 4
WBC, CSF: 1 /mm3 (ref 0–5)
WBC, CSF: 1 /mm3 (ref 0–5)

## 2020-11-12 LAB — CBC
HCT: 34.6 % — ABNORMAL LOW (ref 39.0–52.0)
Hemoglobin: 11.2 g/dL — ABNORMAL LOW (ref 13.0–17.0)
MCH: 32.2 pg (ref 26.0–34.0)
MCHC: 32.4 g/dL (ref 30.0–36.0)
MCV: 99.4 fL (ref 80.0–100.0)
Platelets: 224 10*3/uL (ref 150–400)
RBC: 3.48 MIL/uL — ABNORMAL LOW (ref 4.22–5.81)
RDW: 11.6 % (ref 11.5–15.5)
WBC: 6.5 10*3/uL (ref 4.0–10.5)
nRBC: 0 % (ref 0.0–0.2)

## 2020-11-12 LAB — COMPREHENSIVE METABOLIC PANEL
ALT: 30 U/L (ref 0–44)
AST: 22 U/L (ref 15–41)
Albumin: 2.9 g/dL — ABNORMAL LOW (ref 3.5–5.0)
Alkaline Phosphatase: 83 U/L (ref 38–126)
Anion gap: 9 (ref 5–15)
BUN: 16 mg/dL (ref 6–20)
CO2: 28 mmol/L (ref 22–32)
Calcium: 9.1 mg/dL (ref 8.9–10.3)
Chloride: 102 mmol/L (ref 98–111)
Creatinine, Ser: 1.04 mg/dL (ref 0.61–1.24)
GFR, Estimated: >60 mL/min (ref >60)
Glucose, Bld: 110 mg/dL — ABNORMAL HIGH (ref 70–99)
Potassium: 3.8 mmol/L (ref 3.5–5.1)
Sodium: 139 mmol/L (ref 135–145)
Total Bilirubin: 0.5 mg/dL (ref 0.3–1.2)
Total Protein: 6.2 g/dL — ABNORMAL LOW (ref 6.5–8.1)

## 2020-11-12 LAB — PROTEIN AND GLUCOSE, CSF
Glucose, CSF: 60 mg/dL (ref 40–70)
Total  Protein, CSF: 97 mg/dL — ABNORMAL HIGH (ref 15–45)

## 2020-11-12 IMAGING — XA IR FLUORO GUIDE NDL PLMT / BX
4 series · 12 of 24 positions shown · non-contrast
Comparison: None.

CLINICAL DATA: [facility] is a 55 y.o. male with PMH of headache,
anxiety, TBI in [PQ], and seizure disorder who presented to [facility] due
to AMS and is currently admitted to [facility] since [DATE].

[Series 1: fl angio · 3 acquisitions, 3 frames shown (1 of 4)]
[im 1/3]
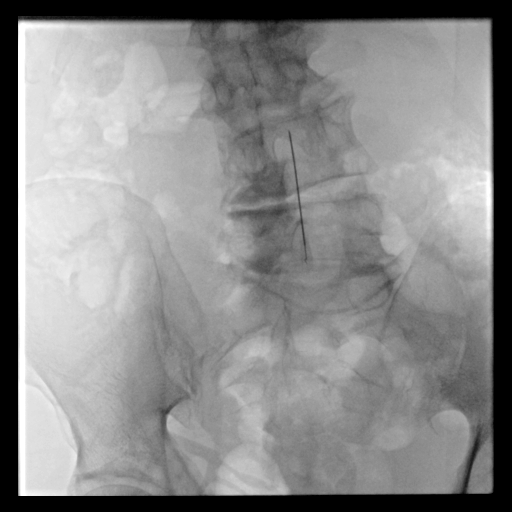
[im 2/3]
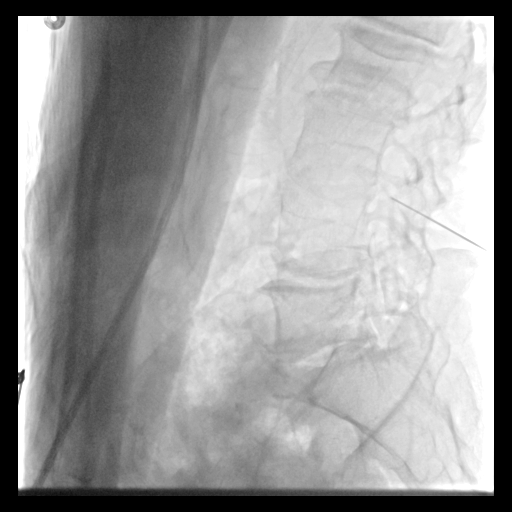
[im 2/3]
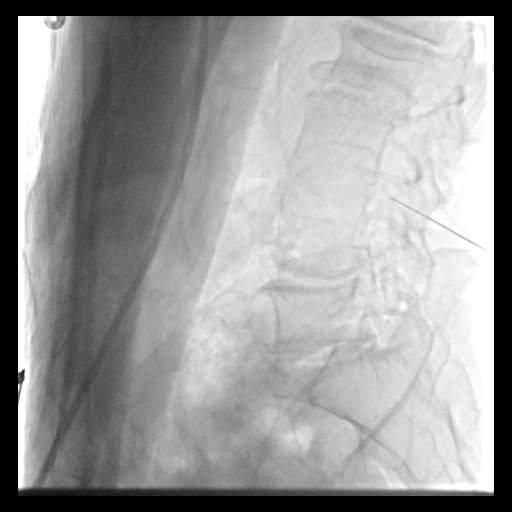

[Series 2: fl angio · 4 acquisitions, 4 frames shown (2 of 4)]
[im 1/4]
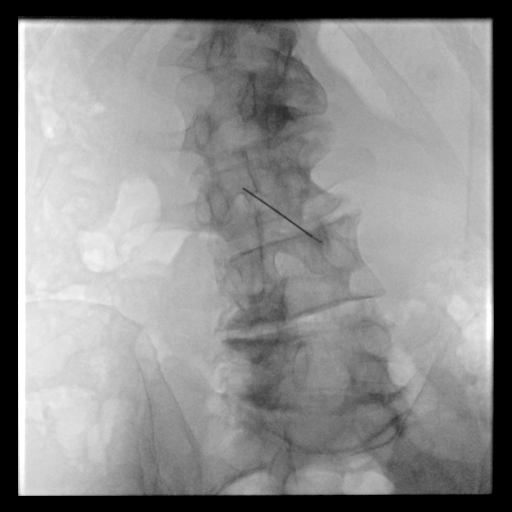
[im 1/4]
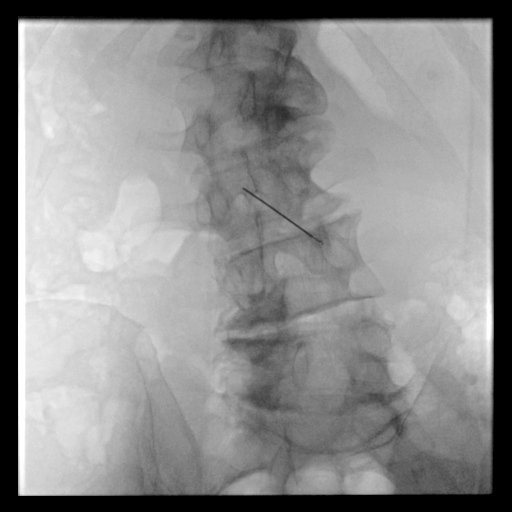
[im 2/4]
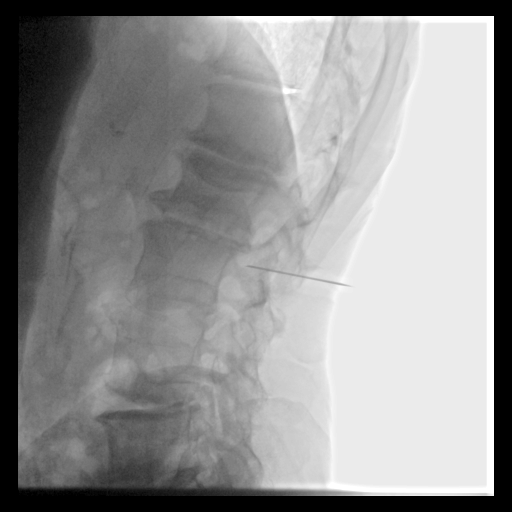
[im 4/4]
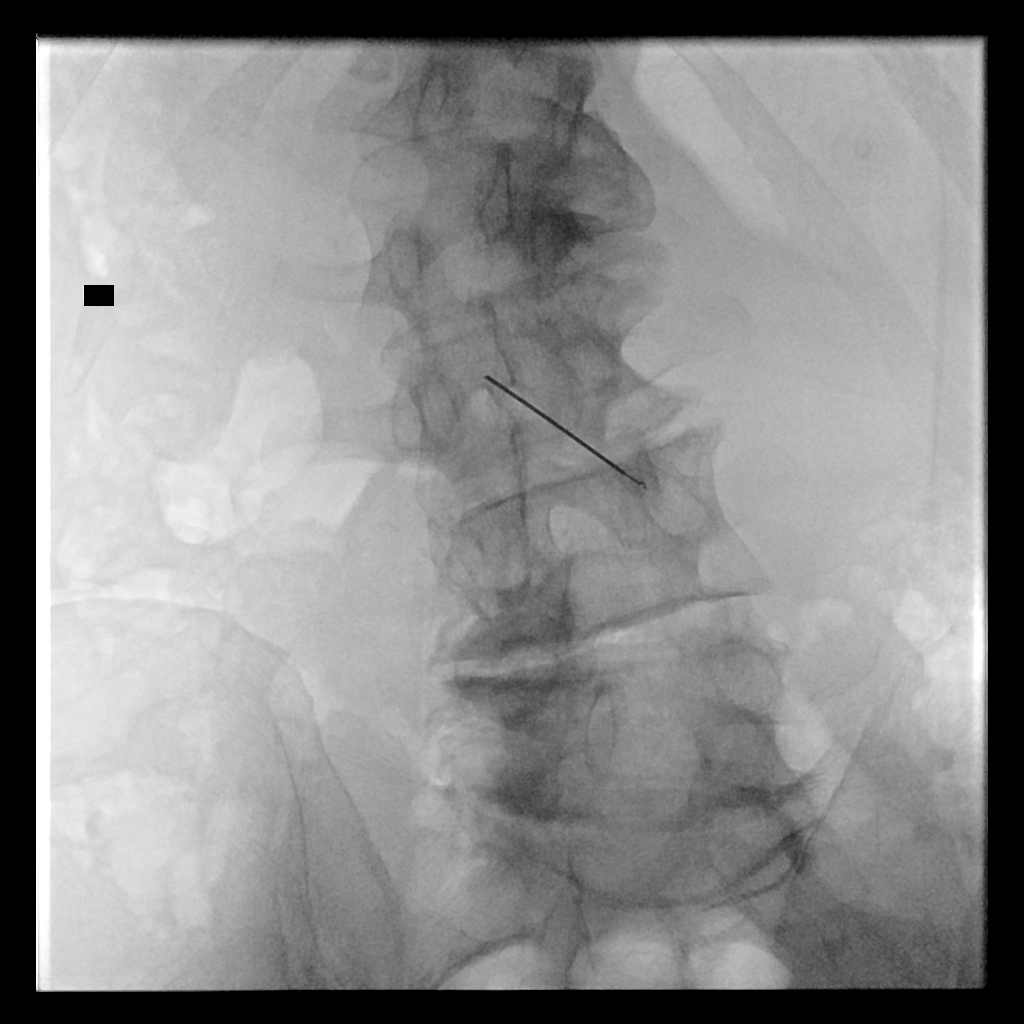

[Series 3: fl angio · 2 acquisitions, 3 frames shown (3 of 4)]
[im 1/2]
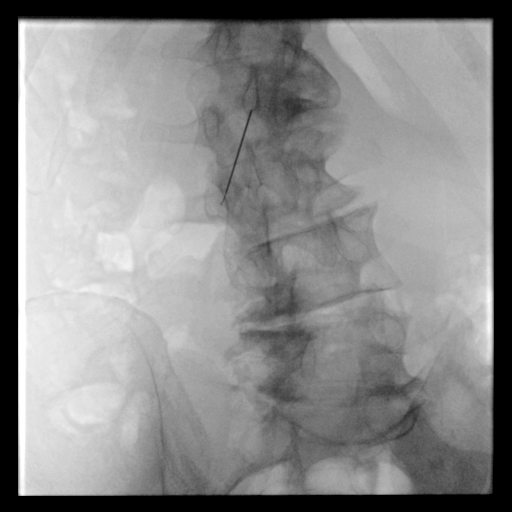
[im 2/2]
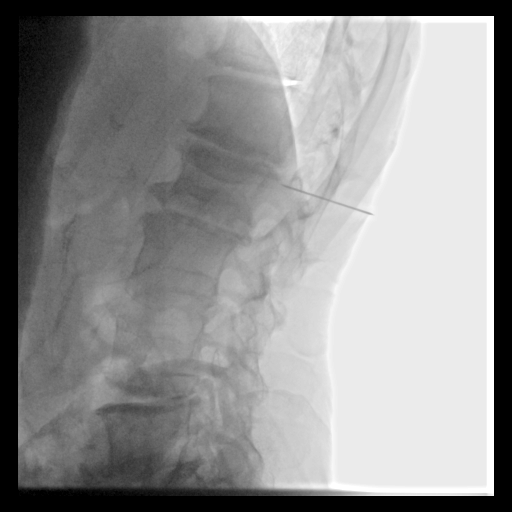
[im 2/2]
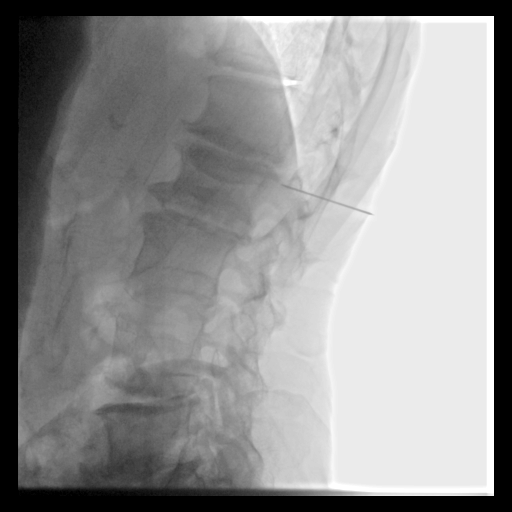

[Series 4: fl angio · 2 of 9 frames shown (4 of 4)]
[frame 5/9]
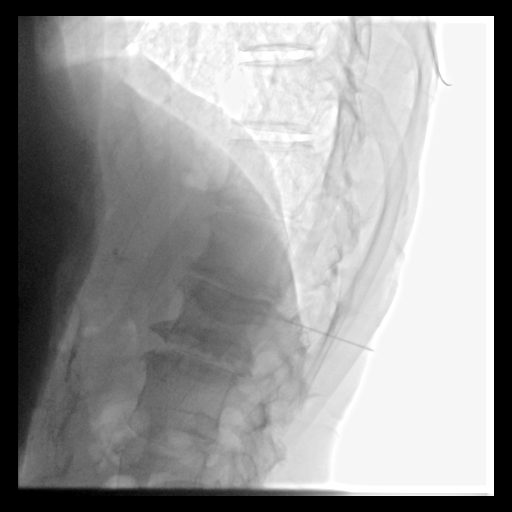
[frame 9/9]
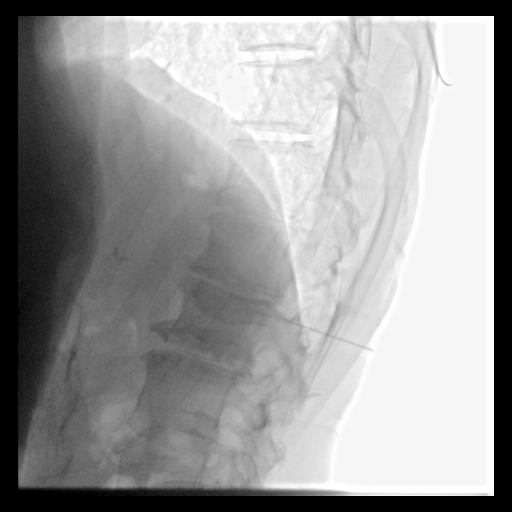

[12 of 24 positions shown; findings below may reference images not displayed]

During admission at the [facility] CT chest showed a mass in the left
lingula and surrounding postobstructive pneumonitis. MR head was
obtained to rule out metastasis, and it showed no acute intracranial
process. Given questionable paraneoplastic limbic encephalopathy, a
lumbar puncture with general anesthesia was requested by the
referring team. After thorough discussion and shared decision
making, patient's wife decided to proceed with the lumbar puncture.

EXAM:
DIAGNOSTIC LUMBAR PUNCTURE UNDER FLUOROSCOPIC GUIDANCE
FLUOROSCOPY TIME:  Fluoroscopy Time:  1.5 minute

Radiation Exposure Index (if provided by the fluoroscopic device):
[PQ]

Number of Acquired Spot Images: 8

PROCEDURE:
Informed consent was obtained from the patient's wide prior to the
procedure, including potential complications of headache, allergy,
and pain.

The procedure was performed under general anesthesia.

With the patient prone, the lower back was prepped with Betadine.
Lumbar puncture was performed at the L2-3 and L3-4 levels using a 16
gauge needle with return of clear CSF with an opening pressure of 11
cm water. Ten ml of CSF were obtained for laboratory studies. The
patient tolerated the procedure well and there were no apparent
complications.
IMPRESSION: Successful fluoroscopy guided lumbar puncture.

## 2020-11-12 SURGERY — IR WITH ANESTHESIA
Anesthesia: Choice

## 2020-11-12 MED ORDER — AMISULPRIDE (ANTIEMETIC) 5 MG/2ML IV SOLN: 10.0000 mg | Freq: Once | INTRAVENOUS | Status: DC | PRN

## 2020-11-12 MED ORDER — OXYCODONE HCL 5 MG/5ML PO SOLN: 5.0000 mg | Freq: Once | ORAL | Status: DC | PRN

## 2020-11-12 MED ORDER — FENTANYL CITRATE (PF) 100 MCG/2ML IJ SOLN: 25.0000 ug | INTRAMUSCULAR | Status: DC | PRN

## 2020-11-12 MED ORDER — ALBUMIN HUMAN 5 % IV SOLN: INTRAVENOUS | Status: DC | PRN

## 2020-11-12 MED ORDER — ONDANSETRON HCL 4 MG/2ML IJ SOLN
INTRAMUSCULAR | Status: DC | PRN
  Administered 2020-11-12: 4 mg via INTRAVENOUS

## 2020-11-12 MED ORDER — PHENYLEPHRINE 40 MCG/ML (10ML) SYRINGE FOR IV PUSH (FOR BLOOD PRESSURE SUPPORT)
PREFILLED_SYRINGE | INTRAVENOUS | Status: DC | PRN
  Administered 2020-11-12 (×4): 200 ug via INTRAVENOUS

## 2020-11-12 MED ORDER — PHENYLEPHRINE HCL-NACL 10-0.9 MG/250ML-% IV SOLN
INTRAVENOUS | Status: DC | PRN
  Administered 2020-11-12: 80 ug/min via INTRAVENOUS

## 2020-11-12 MED ORDER — QUETIAPINE FUMARATE 50 MG PO TABS
50.0000 mg | ORAL_TABLET | Freq: Two times a day (BID) | ORAL | Status: DC | PRN
  Administered 2020-11-12 – 2020-11-18 (×7): 50 mg via ORAL
  Filled 2020-11-12 (×8): qty 1

## 2020-11-12 MED ORDER — LIDOCAINE HCL (PF) 1 % IJ SOLN
INTRAMUSCULAR | Status: AC
  Filled 2020-11-12: qty 30

## 2020-11-12 MED ORDER — MIDAZOLAM HCL 2 MG/2ML IJ SOLN
INTRAMUSCULAR | Status: DC | PRN
  Administered 2020-11-12: 2 mg via INTRAVENOUS

## 2020-11-12 MED ORDER — SUGAMMADEX SODIUM 200 MG/2ML IV SOLN
INTRAVENOUS | Status: DC | PRN
  Administered 2020-11-12: 200 mg via INTRAVENOUS

## 2020-11-12 MED ORDER — ONDANSETRON HCL 4 MG/2ML IJ SOLN: 4.0000 mg | Freq: Once | INTRAMUSCULAR | Status: DC | PRN

## 2020-11-12 MED ORDER — OXYCODONE HCL 5 MG PO TABS: 5.0000 mg | ORAL_TABLET | Freq: Once | ORAL | Status: DC | PRN

## 2020-11-12 MED ORDER — ROCURONIUM BROMIDE 10 MG/ML (PF) SYRINGE
PREFILLED_SYRINGE | INTRAVENOUS | Status: DC | PRN
  Administered 2020-11-12: 50 mg via INTRAVENOUS

## 2020-11-12 MED ORDER — EPHEDRINE SULFATE-NACL 50-0.9 MG/10ML-% IV SOSY
PREFILLED_SYRINGE | INTRAVENOUS | Status: DC | PRN
  Administered 2020-11-12 (×2): 5 mg via INTRAVENOUS

## 2020-11-12 MED ORDER — LACTATED RINGERS IV SOLN: INTRAVENOUS | Status: DC | PRN

## 2020-11-12 MED ORDER — DEXAMETHASONE SODIUM PHOSPHATE 10 MG/ML IJ SOLN
INTRAMUSCULAR | Status: DC | PRN
  Administered 2020-11-12: 5 mg via INTRAVENOUS

## 2020-11-12 MED ORDER — PROPOFOL 10 MG/ML IV BOLUS
INTRAVENOUS | Status: DC | PRN
  Administered 2020-11-12: 130 mg via INTRAVENOUS

## 2020-11-12 MED ORDER — LIDOCAINE 2% (20 MG/ML) 5 ML SYRINGE
INTRAMUSCULAR | Status: DC | PRN
  Administered 2020-11-12: 60 mg via INTRAVENOUS

## 2020-11-12 NOTE — Anesthesia Postprocedure Evaluation (Signed)
Anesthesia Post Note  Patient: Benjamin [name]  Procedure(s) Performed: IR WITH ANESTHESIA     Patient location during evaluation: PACU Level of consciousness: responds to stimulation and sedated (preop baseline) Pain management: pain level controlled Vital Signs Assessment: post-procedure vital signs reviewed and stable Respiratory status: spontaneous breathing, nonlabored ventilation and respiratory function stable Cardiovascular status: blood pressure returned to baseline and stable Postop Assessment: no apparent nausea or vomiting Anesthetic complications: no   No notable events documented.  Last Vitals:  Vitals:   11/12/20 1308 11/12/20 1323  BP: 116/79 113/75  Pulse: 75 (!) 59  Resp: 15 12  Temp:    SpO2: 98% 97%    Last Pain:  Vitals:   11/12/20 1323  TempSrc:   PainSc: 0-No pain                 Lidia Collum

## 2020-11-12 NOTE — Transfer of Care (Signed)
Immediate Anesthesia Transfer of Care Note  Patient: Benjamin [name]  Procedure(s) Performed: IR WITH ANESTHESIA  Patient Location: PACU  Anesthesia Type:General  Level of Consciousness: sedated and patient cooperative  Restraints in place  Airway & Oxygen Therapy: Patient Spontanous Breathing  Post-op Assessment: Report given to RN, Post -op Vital signs reviewed and stable and Patient moving all extremities  Post vital signs: Reviewed and stable  Last Vitals:  Vitals Value Taken Time  BP 113/76 11/12/20 1253  Temp    Pulse 72 11/12/20 1256  Resp 13 11/12/20 1256  SpO2 97 % 11/12/20 1256  Vitals shown include unvalidated device data.  Last Pain:  Vitals:   11/12/20 1100  TempSrc:   PainSc: 0-No pain      Patients Stated Pain Goal: 0 (20/23/34 3568)  Complications: No notable events documented.

## 2020-11-12 NOTE — Anesthesia Preprocedure Evaluation (Addendum)
Anesthesia Evaluation  Patient identified by MRN, date of birth, ID band Patient awake    Reviewed: Allergy & Precautions, NPO status , Patient's Chart, lab work & pertinent test results  History of Anesthesia Complications Negative for: history of anesthetic complications  Airway Mallampati: II  TM Distance: >3 FB Neck ROM: Full    Dental  (+) Missing, Poor Dentition   Pulmonary Current Smoker,    Pulmonary exam normal        Cardiovascular negative cardio ROS Normal cardiovascular exam     Neuro/Psych Seizures -,  Remote h/o TBI Acute encephalopathy    GI/Hepatic negative GI ROS, Neg liver ROS,   Endo/Other  negative endocrine ROS  Renal/GU negative Renal ROS  negative genitourinary   Musculoskeletal negative musculoskeletal ROS (+)   Abdominal   Peds  Hematology negative hematology ROS (+)   Anesthesia Other Findings  Presented to OSH with altered mental status and found suspected lung cancer. Plan for LP to look for paraneoplastic process. Pt has been combative requiring 4 point restraints.  Reproductive/Obstetrics                          Anesthesia Physical Anesthesia Plan  ASA: 3  Anesthesia Plan: General   Post-op Pain Management:    Induction: Intravenous  PONV Risk Score and Plan: 1 and Ondansetron, Dexamethasone, Treatment may vary due to age or medical condition and Midazolam  Airway Management Planned: Oral ETT  Additional Equipment: None  Intra-op Plan:   Post-operative Plan: Extubation in OR  Informed Consent: I have reviewed the patients History and Physical, chart, labs and discussed the procedure including the risks, benefits and alternatives for the proposed anesthesia with the patient or authorized representative who has indicated his/her understanding and acceptance.     Consent reviewed with POA  Plan Discussed with: Anesthesiologist  Anesthesia  Plan Comments:         Anesthesia Quick Evaluation

## 2020-11-12 NOTE — Progress Notes (Signed)
Tele monitor removed for transport to OR, MD aware. Transported in bed to OR.

## 2020-11-12 NOTE — Consult Note (Signed)
  Was asked by Primary team to reassess patient's capacity.  Patient appears to be encephalopathic and due to his delirium patient's ability to have awareness of his situation fluctuates. On assessment this AM patient is too drowsy to participate in a full exam but continues to say "the same place" when asked where he is located. Patient continues to appear cachetic on exam and is only covered by a blanket lying flat in his bed in 4 point restraints and mittens.   On assessment this AM patient was extremely drowsy despite RN reporting that patient had been up and awake 2 hours prior. Patient had not received any medication in that time that would have constituted such drowsiness within that time frame. Patient was also in 4 point restraints and RN reported she was unaware of any behavior issues overnight that would have warranted this. She took his ankle restraints off.   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 Agree with neurology's recommendations to decrease use of deliriogenic medications Haldol and Ativan and will recommended PRN Seroquel for agitation. Continued work up for etiology for patient's AMS is necessary to better treat patient as antipsychotic medications can treat symptoms but will not adequately address the problem if it is due to an organic cause. 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 As stated multiple times before it is very rare for an individual to develop schizophrenia or schizoaffective disorder at 55 yo especially in the setting of a newly diagnosed neoplasm.   Also recommend that Delirium protocol be continued for patient including minimizing the amount of items patient is connected to as this has been one of the main reasons patient has been agitated and placed in restraints.   Do appreciate that staff continue to try to reorient patient and keep his blinds open to assist with his sleep pattern and  orientation.   Patient continues to appear to not have capacity.   Recommendations - Continued delirium protocol - Stop Haldol and Ativan PRN, will stop use at this time as we do not recommend having 3 antipsychotics available for administration as this can cause adverse cardiac effects. - Seroquel 50 mg PRN q 12h for agitation  - If possible a sitter is preferred for this patient - Decrease use of restraints

## 2020-11-12 NOTE — Progress Notes (Signed)
Patient refusing to allow phlebotomy draw his blood.  Discussed the necessity of labs to go with labs from LP.  MD notified.

## 2020-11-12 NOTE — Progress Notes (Signed)
Patient returned to room post procedure. Monitors in place and patient drowsy, but responsive to voice. Wrist and lap restraint in place.

## 2020-11-12 NOTE — Procedures (Signed)
INTERVENTIONAL NEURORADIOLOGY BRIEF POSTPROCEDURE NOTE  LUMBAR PUNCTURE   Attending: Dr. Erven Colla de Sindy Messing  Assistant: None.  Diagnosis: Encephalopathy  Access site: Percutaneous at L3-4 and L2-3  Anesthesia: General anesthesia  Medication used: refer to anesthesia documentation.  Complications: None.  Estimated blood loss: None.  Specimen: 10 mL of CSF  Fluoroscopy guided lumbar puncture performed with 10 mL of clear CSF. Opening pressure 11 cm of H2O.  The patient tolerated the procedure well without incident or complication and is in stable condition.

## 2020-11-12 NOTE — Progress Notes (Signed)
Patient had managed to to get out of wrist restraints. Was sitting in bed quietly and not pulling at lines.  Will continue to monitor to see if patient can remain out of restraints. Following directions.

## 2020-11-12 NOTE — Hospital Course (Signed)
55 year old white male

## 2020-11-12 NOTE — Plan of Care (Signed)
Neurology Plan of Care  Patient unavailable for assessment / examination this afternoon due to being in IR for LP under anesthesia.  Neurology will continue to follow up with patient and CSF results. Bronchoscopy planned for tomorrow 11/13/2020, patient will need IVIG initiated following bronch tomorrow.   Plan discussed with attending provider Anibal Henderson, AGAC-NP [facility]  Pager: [phone]  I agree with the above care plan  Su Monks, MD [facility] [phone]  If 7pm- 7am, please page neurology on call as listed in [place].

## 2020-11-12 NOTE — Anesthesia Procedure Notes (Signed)
Procedure Name: Intubation Date/Time: 11/12/2020 11:42 AM Performed by: Leonor Liv, CRNA Pre-anesthesia Checklist: Patient identified, Emergency Drugs available, Suction available and Patient being monitored Patient Re-evaluated:Patient Re-evaluated prior to induction Oxygen Delivery Method: Circle System Utilized Preoxygenation: Pre-oxygenation with 100% oxygen Induction Type: IV induction Ventilation: Mask ventilation without difficulty Laryngoscope Size: Mac and 4 Grade View: Grade I Tube type: Oral Tube size: 7.5 mm Number of attempts: 1 Airway Equipment and Method: Stylet and Oral airway Placement Confirmation: ETT inserted through vocal cords under direct vision, positive ETCO2 and breath sounds checked- equal and bilateral Secured at: 23 cm Tube secured with: Tape Dental Injury: Teeth and Oropharynx as per pre-operative assessment

## 2020-11-12 NOTE — Progress Notes (Signed)
PROGRESS NOTE    Benjamin [name]  ACZ:[id number] DOB: [birth year] DOA: 11/04/2020 PCP: Glenda Chroman, MD   Brief Narrative:  Benjamin [name] is a 55 y.o. male with medical history significant for seizure disorder who presented to [facility] emergency room with altered mental status.  His wife reported that he had been acting very erratically.  He was complaining of pain in his left foot.  Reportedly he had an injury to his left foot at home in the last day or 2 and has had pain in the foot since then.  He was found to have fracture of his left first toe.  Placed in a postop shoe in the emergency room.  He had an erratic behavior and was very anxious and uncooperative and he was dosed with Haldol Ativan and Benadryl in the emergency room.  Chest x-ray revealed a lingular pneumonia versus a central lung mass.  CT of his chest was obtained which showed a mass in the left lingula and surrounding postobstructive pneumonitis.  The mass extended into the left hilum and he had left hilar adenopathy.  He also had a mass in the right upper lobe.  MRI was obtained to make sure there is no metastasis that would be causing his symptoms.  MRI did not reveal any intracranial lesions.  Due to lack of specialists at [facility] for further work-up so patient was transferred to [facility].  Imaging at outside facility CT chest 11/04/2020 1. There is an apparent mass arising in the lingula with suspected surrounding postobstructive pneumonitis. This mass extends into the left hilum with apparent left hilar adenopathy which is difficult to delineate from the dominant lingular mass. Advise consideration for Pulmonary Medicine consultation for potential bronchoscopy to obtain tissue for further assessment. 2. Small areas of opacity in the right upper lobe, likely foci of neoplasm with adjacent scarring. Nuclear medicine PET study could be helpful for further characterization of the smaller lesions. 3. Areas of  apparent adenopathy in the subcarinal and right hilar regions as well as the larger apparent adenopathy in the left hilar region. 4. Aortic atherosclerosis. There are foci great vessel and coronary artery calcification.  CTA 10/27/2020 IMPRESSION: 1. No acute pulmonary emboli. 2. Unchanged large left hilar/perihilar cavitary necrotic masslike consolidation in left upper lobe. Other cavitary pulmonary nodules bilaterally. Left hilar lymphadenopathy. Findings highly suspicious for malignancy. 3. Gastric wall thickening may represent gastritis or other etiologies. Follow-up recommended.  Assessment & Plan:   Principal Problem:   Encephalopathy acute Active Problems:   Seizure disorder ([place])   Lung neoplasm   Other fracture of left great toe, initial encounter for closed fracture   Encephalopathy   Autoimmune encephalitis  Acute versus subacute encephalopathy vs acute psychotic break - unclear etiology, POA Rule out paraneoplastic limbic encephalopathy -Appreciate oncology, pulmonology and neurology input regarding patient's unspecified lung mass.   -LP performed successfully 11/12/2020 following CSF cell count differential, protein, glucose, oligoclonal bands, IgG index with serum and CSF paraneoplastic panel per neurology -B12, TSH within normal limits -Currently holding Solu-Medrol and IVIG until LP to further evaluate - steroids could exacerbate patient's mental status given we cannot rule out acute psychosis as above - History of seizure with medication noncompliance, EEG negative  - MRI brain unremarkable - Unlikely withdrawal-patient outside of window  Questionable diagnosis of schizophrenia versus schizoaffective disorder -Orientable to place (hospital) not to time (does not know time)--can tell me this is 2022 -Greatly appreciate psychiatry further insights with med management-holding Haldol, Ativan  at their discretion uses Seroquel primarily, discontinue telemetry and other  devices to prevent irritation and delirium  Incidental lung nodule/opacification/mass, POA - On routine imaging CT of chest showed to have nodules concerning for cancer  - Heme-onc/pulmonology/neurology following to rule out paraneoplastic limbic encephalopathy given above -appreciate pulmonology input regarding bronchoscopy probably on 6/10 -Continues on LR 50 cc/H  Seizure disorder -Cannot rule out acute breakthrough seizure in the setting of noncompliance, EEG negative and no episodes of seizure-like activity since admission -Continue Keppra IV 500 twice daily per neurology   Other fracture of left great toe, initial encounter for closed fracture Post op shoe was placed in the emergency room and will be continued.  No further indication for imaging or intervention at this time. Pain well controlled.   DVT prophylaxis:  Lovenox Code Status:  Full code  Family Communication: Wife updated over phone  Status is: Inpatient  Dispo: The patient is from: Home              Anticipated d/c is to: To be determined              Anticipated d/c date is: >72 hours              Patient currently not medically stable for discharge  Consultants:  Psych, pulmonology, neurology, oncology  Procedures:  Lumbar puncture performed 6/9  Antimicrobials:  None indicated  Subjective:  Patient seen at bedside in restraints however is eating a cookie he answers monosyllables clearly and seems a little bit sleepy but answers as above He does not report any pain His left great toe seems bruised but he tells me he dropped a stereo on it Review of systems is slightly unreliable given his mental state  Objective: Vitals:   11/12/20 1253 11/12/20 1308 11/12/20 1323 11/12/20 1343  BP: 113/76 116/79 113/75 122/82  Pulse: 78 75 (!) 59   Resp: 13 15 12    Temp: 97.9 F (36.6 C)  98 F (36.7 C) 97.7 F (36.5 C)  TempSrc:    Oral  SpO2: 97% 98% 97% 95%  Weight:      Height:        Intake/Output  Summary (Last 24 hours) at 11/12/2020 1811 Last data filed at 11/12/2020 1249 Gross per 24 hour  Intake 1100 ml  Output 1 ml  Net 1099 ml    Filed Weights   11/04/20 2343 11/12/20 1040  Weight: 51.5 kg 51.5 kg    Examination:  Resting no distress eating cookie In restraints Very frail and cachectic appearing poor dentition Abdomen soft no rebound S1-S2 on monitors sinus rhythm with multiple PVCs Abdomen soft no rebound Great toe left side seems to have bruising I did not examine sacrum Psych he is calm although does n appeared to be somewhat confused  Data Reviewed: I have personally reviewed following labs and imaging studies  CBC: Recent Labs  Lab 11/08/20 0207 11/09/20 0439 11/10/20 0438 11/11/20 0345 11/12/20 0034  WBC 6.8 6.3 5.7 6.8 6.5  HGB 11.4* 11.9* 11.5* 11.5* 11.2*  HCT 34.6* 36.5* 35.0* 34.5* 34.6*  MCV 98.3 98.1 98.0 98.3 99.4  PLT 250 231 226 232 967    Basic Metabolic Panel: Recent Labs  Lab 11/08/20 0207 11/09/20 0439 11/10/20 0438 11/11/20 0345 11/12/20 0034  NA 140 142 140 138 139  K 3.9 3.8 3.6 3.6 3.8  CL 104 103 105 100 102  CO2 28 30 26 26 28   GLUCOSE 103* 95 100* 95 110*  BUN 10 8 12 14 16   CREATININE 0.79 0.74 0.68 0.73 1.04  CALCIUM 8.9 9.2 9.1 9.3 9.1    GFR: Estimated Creatinine Clearance: 57.1 mL/min (by C-G formula based on SCr of 1.04 mg/dL). Liver Function Tests: Recent Labs  Lab 11/08/20 0207 11/09/20 0439 11/10/20 0438 11/11/20 0345 11/12/20 0034  AST 79* 46* 25 22 22   ALT 52* 47* 35 30 30  ALKPHOS 94 88 83 81 83  BILITOT 0.4 0.7 0.1* 0.7 0.5  PROT 5.8* 6.6 6.1* 6.4* 6.2*  ALBUMIN 2.7* 2.9* 2.9* 3.0* 2.9*    No results for input(s): LIPASE, AMYLASE in the last 168 hours. No results for input(s): AMMONIA in the last 168 hours. Coagulation Profile: No results for input(s): INR, PROTIME in the last 168 hours. Cardiac Enzymes: No results for input(s): CKTOTAL, CKMB, CKMBINDEX, TROPONINI in the last 168  hours. BNP (last 3 results) No results for input(s): PROBNP in the last 8760 hours. HbA1C: No results for input(s): HGBA1C in the last 72 hours. CBG: No results for input(s): GLUCAP in the last 168 hours. Lipid Profile: No results for input(s): CHOL, HDL, LDLCALC, TRIG, CHOLHDL, LDLDIRECT in the last 72 hours. Thyroid Function Tests: Recent Labs    11/11/20 0345  TSH 0.918    Anemia Panel: Recent Labs    11/11/20 0345  VITAMINB12 295    Sepsis Labs: No results for input(s): PROCALCITON, LATICACIDVEN in the last 168 hours.  Recent Results (from the past 240 hour(s))  CSF culture w Gram Stain     Status: None (Preliminary result)   Collection Time: 11/12/20 12:20 PM   Specimen: CSF; Cerebrospinal Fluid  Result Value Ref Range Status   Specimen Description CSF  Final   Special Requests LP  Final   Gram Stain   Final    WBC PRESENT, PREDOMINANTLY MONONUCLEAR NO ORGANISMS SEEN CYTOSPIN SMEAR Performed at [facility], [place] [address], [place], [place] [address]    Culture PENDING  Incomplete   Report Status PENDING  Incomplete     Radiology Studies: IR Fluoro Guide Ndl Plmt / BX  Result Date: 11/12/2020 CLINICAL DATA:  Dyon Rotert is a 55 y.o. male with PMH of headache, anxiety, TBI in 1986, and seizure disorder who presented to OSH due to AMS and is currently admitted to [facility] since 6/2/222. During admission at the OSH, a CT chest showed a mass in the left lingula and surrounding postobstructive pneumonitis. MR head was obtained to rule out metastasis, and it showed no acute intracranial process. 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 After thorough discussion and shared decision making, patient's wife decided to proceed with the lumbar puncture. EXAM: DIAGNOSTIC LUMBAR PUNCTURE UNDER FLUOROSCOPIC GUIDANCE COMPARISON:  None. FLUOROSCOPY TIME:  Fluoroscopy Time:  1.5 minute  Radiation Exposure Index (if provided by the fluoroscopic device): 32mG y Number of Acquired Spot Images: 8 PROCEDURE: Informed consent was obtained from the patient's wide prior to the procedure, including potential complications of headache, allergy, and pain. The procedure was performed under general anesthesia. With the patient prone, the lower back was prepped with Betadine. Lumbar puncture was performed at the L2-3 and L3-4 levels using a 16 gauge needle with return of clear CSF with an opening pressure of 11 cm water. Ten ml of CSF were obtained for laboratory studies. The patient tolerated the procedure well and there were no apparent complications. IMPRESSION: Successful fluoroscopy guided lumbar puncture. Electronically Signed  By: Pedro Earls M.D.   On: 11/12/2020 14:47    Scheduled Meds:  enoxaparin (LOVENOX) injection  40 mg Subcutaneous Q24H   OLANZapine zydis  5 mg Oral Daily   And   OLANZapine zydis  10 mg Oral QHS   [START ON 11/19/2020] thiamine injection  100 mg Intravenous Daily   Continuous Infusions:  lactated ringers 50 mL/hr at 11/11/20 0222   levETIRAcetam 500 mg (11/12/20 0913)   thiamine injection 500 mg (11/12/20 1517)   Followed by   Derrill Memo ON 11/13/2020] thiamine injection       LOS: 8 days   Time spent: 22min  Verneita Griffes, MD [facility] 6:17 PM   If 7PM-7AM, please contact night-coverage [email or web address]  11/12/2020, 6:11 PM
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
## 2020-11-13 ENCOUNTER — Inpatient Hospital Stay: Payer: Self-pay

## 2020-11-13 ENCOUNTER — Encounter (HOSPITAL_COMMUNITY): Payer: Self-pay | Admitting: Interventional Radiology

## 2020-11-13 LAB — IGG CSF INDEX
Albumin CSF-mCnc: 53 mg/dL (ref 15–55)
Albumin: 4 g/dL (ref 3.8–4.9)
CSF IgG Index: 0.6 (ref 0.0–0.7)
IgG (Immunoglobin G), Serum: 788 mg/dL (ref 603–1613)
IgG, CSF: 6.6 mg/dL (ref 0.0–10.3)
IgG/Alb Ratio, CSF: 0.12 (ref 0.00–0.25)

## 2020-11-13 LAB — COMPREHENSIVE METABOLIC PANEL
ALT: 26 U/L (ref 0–44)
AST: 20 U/L (ref 15–41)
Albumin: 3.4 g/dL — ABNORMAL LOW (ref 3.5–5.0)
Alkaline Phosphatase: 80 U/L (ref 38–126)
Anion gap: 10 (ref 5–15)
BUN: 18 mg/dL (ref 6–20)
CO2: 28 mmol/L (ref 22–32)
Calcium: 9.5 mg/dL (ref 8.9–10.3)
Chloride: 100 mmol/L (ref 98–111)
Creatinine, Ser: 0.91 mg/dL (ref 0.61–1.24)
GFR, Estimated: >60 mL/min (ref >60)
Glucose, Bld: 102 mg/dL — ABNORMAL HIGH (ref 70–99)
Potassium: 4.3 mmol/L (ref 3.5–5.1)
Sodium: 138 mmol/L (ref 135–145)
Total Bilirubin: 0.4 mg/dL (ref 0.3–1.2)
Total Protein: 6.9 g/dL (ref 6.5–8.1)

## 2020-11-13 LAB — CBC
HCT: 37.2 % — ABNORMAL LOW (ref 39.0–52.0)
Hemoglobin: 12.1 g/dL — ABNORMAL LOW (ref 13.0–17.0)
MCH: 32.3 pg (ref 26.0–34.0)
MCHC: 32.5 g/dL (ref 30.0–36.0)
MCV: 99.2 fL (ref 80.0–100.0)
Platelets: 255 10*3/uL (ref 150–400)
RBC: 3.75 MIL/uL — ABNORMAL LOW (ref 4.22–5.81)
RDW: 11.6 % (ref 11.5–15.5)
WBC: 6.9 10*3/uL (ref 4.0–10.5)
nRBC: 0 % (ref 0.0–0.2)

## 2020-11-13 NOTE — Consult Note (Signed)
  Psychiatry was asked to come reassess patient's capacity as patient was refusing procedure today.  Patient is very irritable today and refuses to open his eyes and will only answer a few questions so long as the Provider leaves. Patient reports that he does not believe he has lung cancer and does not believe the claims that something was found on imaging. Patient reports that he believes "y'all are just holding me here." Provider asks patient whether or not he is interested in finding out if he does have cancer patient takes a long time but finally says , "no." When provider asks if patient understands what will happen if he does not find out and just goes home he reports "die."  He then asks provider to go.   Discussed with Dr. Raeanne Barry who was going to conduct the EBUS procedure today, 11/13/2020; however despite order for NPO patient received breakfast thus making patient unable to have procedure. Patient's wife has given permission for procedure to be canceled. It appears that patient did not truly have the ability to reject the procedure today, because he was unable to have it due to recently eating.   Provider also spoke with patient's wife again. Provider summarized that at this time patient really needs work up as his presentation appears to be more delirium rather than a functional psychosis. Provider again reports to wife that antipsychotic medications can help treat patient's symptoms but the cause of his sudden AMS should be investigated in order to provide the best chance of treating the root of his problem. Wife agrees, and tells provider again that the patient cannot come home because he decompensates. Provider explains to wife that the patient is not at his psychiatric baseline even in the hospital, but that he appears to have hypoactive delirium. Provider also notes that as wife stated before the patient receives antipsychotic medications scheduled inpatient but when he goes home he does not  take them and she is not there to make sure he is. Wife agrees with this assessment. Wife reports that she is not in the place to take care of the patient and she is willing to have a meeting with patient's care team to discuss dispo when it is time.   Discussed patient's current mental status with Hospitalist, Dr. Verlon Au and although on this assessment patient appears to have some understanding of refusal of treatment; overall presentation continues to suggest patient is delirious and his medical illness is contributing to his AMS. It is suggested that patient still does not display enough capacity to make his own decisions as patient still remains very confused about his hospitalization and the status of his own health.   PGY-1 Damita Dunnings, MD
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
## 2020-11-13 NOTE — Plan of Care (Signed)
  Problem: Activity: Goal: Risk for activity intolerance will decrease Outcome: Progressing   Problem: Nutrition: Goal: Adequate nutrition will be maintained Outcome: Progressing   Problem: Elimination: Goal: Will not experience complications related to bowel motility Outcome: Progressing   Problem: Skin Integrity: Goal: Risk for impaired skin integrity will decrease Outcome: Progressing

## 2020-11-13 NOTE — Progress Notes (Signed)
Patient remains to be clam and cooperative throughout the day. Had times when he tried to get off the bed and able to redirect. BL wrist and ankle restrains remains off throughout the day with roll belt in place. Patient CO back discomfort and expressed the will to walk. Patient walked in the hallway using a walker without any difficulty. Patient resting in his room without any distress and his restrains remains off at this time.

## 2020-11-13 NOTE — Plan of Care (Signed)

## 2020-11-13 NOTE — Progress Notes (Signed)
Patient's breakfast tray here. Patient sitting up in the bed and eating his breakfast at this time.

## 2020-11-13 NOTE — Progress Notes (Signed)
New NPO order received for the patient and patient kept NPO at this time.
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
## 2020-11-14 LAB — COMPREHENSIVE METABOLIC PANEL
ALT: 24 U/L (ref 0–44)
AST: 17 U/L (ref 15–41)
Albumin: 3.2 g/dL — ABNORMAL LOW (ref 3.5–5.0)
Alkaline Phosphatase: 74 U/L (ref 38–126)
Anion gap: 9 (ref 5–15)
BUN: 20 mg/dL (ref 6–20)
CO2: 28 mmol/L (ref 22–32)
Calcium: 9.5 mg/dL (ref 8.9–10.3)
Chloride: 103 mmol/L (ref 98–111)
Creatinine, Ser: 0.8 mg/dL (ref 0.61–1.24)
GFR, Estimated: >60 mL/min (ref >60)
Glucose, Bld: 123 mg/dL — ABNORMAL HIGH (ref 70–99)
Potassium: 4.1 mmol/L (ref 3.5–5.1)
Sodium: 140 mmol/L (ref 135–145)
Total Bilirubin: 0.4 mg/dL (ref 0.3–1.2)
Total Protein: 6.8 g/dL (ref 6.5–8.1)

## 2020-11-14 LAB — CBC
HCT: 36.4 % — ABNORMAL LOW (ref 39.0–52.0)
Hemoglobin: 12.1 g/dL — ABNORMAL LOW (ref 13.0–17.0)
MCH: 32.5 pg (ref 26.0–34.0)
MCHC: 33.2 g/dL (ref 30.0–36.0)
MCV: 97.8 fL (ref 80.0–100.0)
Platelets: 288 10*3/uL (ref 150–400)
RBC: 3.72 MIL/uL — ABNORMAL LOW (ref 4.22–5.81)
RDW: 11.6 % (ref 11.5–15.5)
WBC: 7.7 10*3/uL (ref 4.0–10.5)
nRBC: 0 % (ref 0.0–0.2)

## 2020-11-14 MED ORDER — LEVETIRACETAM 500 MG PO TABS
500.0000 mg | ORAL_TABLET | Freq: Two times a day (BID) | ORAL | Status: DC
  Administered 2020-11-14 – 2020-11-28 (×29): 500 mg via ORAL
  Filled 2020-11-14 (×29): qty 1

## 2020-11-14 MED ORDER — IBUPROFEN 200 MG PO TABS
400.0000 mg | ORAL_TABLET | Freq: Four times a day (QID) | ORAL | Status: DC
  Administered 2020-11-14 – 2020-11-28 (×50): 400 mg via ORAL
  Filled 2020-11-14 (×52): qty 2

## 2020-11-14 NOTE — Plan of Care (Signed)

## 2020-11-14 NOTE — Progress Notes (Signed)
Restraints orders expired this shift. Patient is no longer pulling out tubes or interfering with care.
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
## 2020-11-14 NOTE — Plan of Care (Signed)
  Problem: Education: Goal: Knowledge of General Education information will improve Description: Including pain rating scale, medication(s)/side effects and non-pharmacologic comfort measures Outcome: Progressing   Problem: Clinical Measurements: Goal: Will remain free from infection Outcome: Progressing Goal: Respiratory complications will improve Outcome: Progressing   Problem: Safety: Goal: Ability to remain free from injury will improve Outcome: Progressing
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
## 2020-11-15 LAB — CBC
HCT: 37.1 % — ABNORMAL LOW (ref 39.0–52.0)
Hemoglobin: 11.9 g/dL — ABNORMAL LOW (ref 13.0–17.0)
MCH: 32.1 pg (ref 26.0–34.0)
MCHC: 32.1 g/dL (ref 30.0–36.0)
MCV: 100 fL (ref 80.0–100.0)
Platelets: 315 10*3/uL (ref 150–400)
RBC: 3.71 MIL/uL — ABNORMAL LOW (ref 4.22–5.81)
RDW: 11.7 % (ref 11.5–15.5)
WBC: 7.8 10*3/uL (ref 4.0–10.5)
nRBC: 0 % (ref 0.0–0.2)

## 2020-11-15 LAB — CSF CULTURE W GRAM STAIN: Culture: NO GROWTH

## 2020-11-15 LAB — COMPREHENSIVE METABOLIC PANEL
ALT: 25 U/L (ref 0–44)
AST: 15 U/L (ref 15–41)
Albumin: 3.4 g/dL — ABNORMAL LOW (ref 3.5–5.0)
Alkaline Phosphatase: 80 U/L (ref 38–126)
Anion gap: 10 (ref 5–15)
BUN: 16 mg/dL (ref 6–20)
CO2: 24 mmol/L (ref 22–32)
Calcium: 9.6 mg/dL (ref 8.9–10.3)
Chloride: 106 mmol/L (ref 98–111)
Creatinine, Ser: 0.75 mg/dL (ref 0.61–1.24)
GFR, Estimated: >60 mL/min (ref >60)
Glucose, Bld: 127 mg/dL — ABNORMAL HIGH (ref 70–99)
Potassium: 3.9 mmol/L (ref 3.5–5.1)
Sodium: 140 mmol/L (ref 135–145)
Total Bilirubin: 0.3 mg/dL (ref 0.3–1.2)
Total Protein: 7 g/dL (ref 6.5–8.1)

## 2020-11-15 NOTE — Progress Notes (Signed)
Pt pulled IV out and has refused to receive another IV.
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
## 2020-11-15 NOTE — Progress Notes (Signed)
PROGRESS NOTE    Benjamin [name]  WER:[id number] DOB: 02/26/1966 DOA: 11/04/2020 PCP: Glenda Chroman, MD   Brief Narrative:  Benjamin [name] is a 55 y.o. male with medical history significant for seizure disorder who presented to [facility] emergency room with altered mental status.  His wife reported that he had been acting very erratically.  He was complaining of pain in his left foot.  Reportedly he had an injury to his left foot at home in the last day or 2 and has had pain in the foot since then.  He was found to have fracture of his left first toe.  Placed in a postop shoe in the emergency room.  He had an erratic behavior and was very anxious and uncooperative and he was dosed with Haldol Ativan and Benadryl in the emergency room.  Chest x-ray revealed a lingular pneumonia versus a central lung mass.  CT of his chest was obtained which showed a mass in the left lingula and surrounding postobstructive pneumonitis.  The mass extended into the left hilum and he had left hilar adenopathy.  He also had a mass in the right upper lobe.  MRI was obtained to make sure there is no metastasis that would be causing his symptoms.  MRI did not reveal any intracranial lesions.  Due to lack of specialists at [facility] for further work-up so patient was transferred to [facility].  Imaging at outside facility CT chest 11/04/2020 1. There is an apparent mass arising in the lingula with suspected surrounding postobstructive pneumonitis. This mass extends into the left hilum with apparent left hilar adenopathy which is difficult to delineate from the dominant lingular mass. Advise consideration for Pulmonary Medicine consultation for potential bronchoscopy to obtain tissue for further assessment. 2. Small areas of opacity in the right upper lobe, likely foci of neoplasm with adjacent scarring. Nuclear medicine PET study could be helpful for further characterization of the smaller lesions. 3. Areas of  apparent adenopathy in the subcarinal and right hilar regions as well as the larger apparent adenopathy in the left hilar region. 4. Aortic atherosclerosis. There are foci great vessel and coronary artery calcification.  CTA 10/27/2020 IMPRESSION: 1. No acute pulmonary emboli. 2. Unchanged large left hilar/perihilar cavitary necrotic masslike consolidation in left upper lobe. Other cavitary pulmonary nodules bilaterally. Left hilar lymphadenopathy. Findings highly suspicious for malignancy. 3. Gastric wall thickening may represent gastritis or other etiologies. Follow-up recommended.  Assessment & Plan:   Principal Problem:   Encephalopathy acute Active Problems:   Seizure disorder ([place])   Lung neoplasm   Other fracture of left great toe, initial encounter for closed fracture   Encephalopathy   Autoimmune encephalitis  Acute versus subacute encephalopathy vs acute psychotic break - unclear etiology, POA Rule out paraneoplastic limbic encephalopathy -Appreciate oncology, pulmonology and neurology input regarding patient's unspecified lung mass.   -LP 11/12/2020  CSF nonsuggestive of viral/bacterial viral/bacterial infection Oligoclonal bands, IgG index shows low albumin IgG and CSF 6.6 CSF paraneoplastic panel per neurology still pending ?IVIG versus IV steroids for limbic encephalopathy deferred to neurologist Dr. Quinn Axe  -B12, TSH within normal limits -continue vitamin B1 as per neurologist - History of seizure with medication noncompliance, EEG negative  - MRI brain unremarkable - Unlikely withdrawal-patient outside of window  Significant behavioral overlay superimposed upon Questionable diagnosis of schizophrenia versus schizoaffective disorder -Orientable to place (hospital) not to time (does not know time)--he thinks he is in [facility] today -Medication management defer to psychiatry-he doesn't  have capacity based on their repeat evaluations and he will need  reevaluation in the next several days TOC will also need to see him to help dispo planning as wife states cannot take care of him -NO Haldol, Ativan  - Seroquel 50 mg twice daily as needed agitation primarily, Continue Zydis 5 AM, 10 PM -he is calmer now  Incidental lung nodule/opacification/mass, POA - CT of chest showed to have nodules concerning for cancer  - Heme-onc/pulmonology/neurology following to rule out paraneoplastic limbic encephalopathy given above -N.p.o. after midnight 6/12 planning for EBUS 6/13 Dr. Erin Fulling pulmonology -I have discussed with nursing staff to make sure that they do not feed him past midnight  Seizure disorder -Cannot rule out acute breakthrough seizure in the setting of noncompliance, EEG negative and no episodes of seizure-like activity since no family present at bedside admission -Keppra transitioned to p.o. continue 500 twice daily   Other fracture of left great toe, initial encounter for closed fracture -Post op shoe was placed in the emergency room and will be continued.   -No further indication for imaging or intervention at this time. Pain well controlled.   DVT prophylaxis:  Lovenox Code Status:  Full code  Family Communication:   Status is: Inpatient  Dispo: The patient is from: Home              Anticipated d/c is to: To be determined              Anticipated d/c date is: >72 hours              Patient currently not medically stable for discharge  Consultants:  Psych, pulmonology, neurology, oncology  Procedures:  Lumbar puncture performed 6/9  Antimicrobials:  None indicated  Subjective:  More redirectable however a little bit hyperkinetic-keeps getting up to the cupboard in the room Is calm not agitated and is allowing care Is wearing street clothes he is poorly groomed but not combative   Objective: Vitals:   11/14/20 0516 11/14/20 1242 11/14/20 2055 11/15/20 0537  BP: (!) 143/79 130/85 (!) 139/97 (!) 154/103  Pulse: 82  80 76 (!) 108  Resp:  16 18   Temp: 98.8 F (37.1 C) 98.9 F (37.2 C) 98.6 F (37 C) 99.4 F (37.4 C)  TempSrc:   Oral   SpO2: 96% 99% 100% 94%  Weight:      Height:        Intake/Output Summary (Last 24 hours) at 11/15/2020 0727 Last data filed at 11/14/2020 1800 Gross per 24 hour  Intake 120 ml  Output --  Net 120 ml    Filed Weights   11/04/20 2343 11/12/20 1040  Weight: 51.5 kg 51.5 kg    Examination:  Cachectic white male emaciated Chest clear no rales rhonchi S1-S2 no murmur Abdomen soft Foot is unchanged on left side Neurologically is able to move around reasonably well and I assessed his gait and he looks comfortable without any deficits and good prosody  Data Reviewed: I have personally reviewed following labs and imaging studies  CBC: Recent Labs  Lab 11/10/20 0438 11/11/20 0345 11/12/20 0034 11/13/20 0138 11/14/20 0343  WBC 5.7 6.8 6.5 6.9 7.7  HGB 11.5* 11.5* 11.2* 12.1* 12.1*  HCT 35.0* 34.5* 34.6* 37.2* 36.4*  MCV 98.0 98.3 99.4 99.2 97.8  PLT 226 232 224 255 250    Basic Metabolic Panel: Recent Labs  Lab 11/10/20 0438 11/11/20 0345 11/12/20 0034 11/13/20 0138 11/14/20 0343  NA 140 138 139  138 140  K 3.6 3.6 3.8 4.3 4.1  CL 105 100 102 100 103  CO2 26 26 28 28 28   GLUCOSE 100* 95 110* 102* 123*  BUN 12 14 16 18 20   CREATININE 0.68 0.73 1.04 0.91 0.80  CALCIUM 9.1 9.3 9.1 9.5 9.5    GFR: Estimated Creatinine Clearance: 74.2 mL/min (by C-G formula based on SCr of 0.8 mg/dL). Liver Function Tests: Recent Labs  Lab 11/10/20 0438 11/11/20 0345 11/12/20 0034 11/12/20 1230 11/13/20 0138 11/14/20 0343  AST 25 22 22   --  20 17  ALT 35 30 30  --  26 24  ALKPHOS 83 81 83  --  80 74  BILITOT 0.1* 0.7 0.5  --  0.4 0.4  PROT 6.1* 6.4* 6.2*  --  6.9 6.8  ALBUMIN 2.9* 3.0* 2.9* 4.0 3.4* 3.2*    No results for input(s): LIPASE, AMYLASE in the last 168 hours. No results for input(s): AMMONIA in the last 168 hours. Coagulation  Profile: No results for input(s): INR, PROTIME in the last 168 hours. Cardiac Enzymes: No results for input(s): CKTOTAL, CKMB, CKMBINDEX, TROPONINI in the last 168 hours. BNP (last 3 results) No results for input(s): PROBNP in the last 8760 hours. HbA1C: No results for input(s): HGBA1C in the last 72 hours. CBG: No results for input(s): GLUCAP in the last 168 hours. Lipid Profile: No results for input(s): CHOL, HDL, LDLCALC, TRIG, CHOLHDL, LDLDIRECT in the last 72 hours. Thyroid Function Tests: No results for input(s): TSH, T4TOTAL, FREET4, T3FREE, THYROIDAB in the last 72 hours.  Anemia Panel: No results for input(s): VITAMINB12, FOLATE, FERRITIN, TIBC, IRON, RETICCTPCT in the last 72 hours.  Sepsis Labs: No results for input(s): PROCALCITON, LATICACIDVEN in the last 168 hours.  Recent Results (from the past 240 hour(s))  CSF culture w Gram Stain     Status: None (Preliminary result)   Collection Time: 11/12/20 12:20 PM   Specimen: CSF; Cerebrospinal Fluid  Result Value Ref Range Status   Specimen Description CSF  Final   Special Requests LP  Final   Gram Stain   Final    WBC PRESENT, PREDOMINANTLY MONONUCLEAR NO ORGANISMS SEEN CYTOSPIN SMEAR    Culture   Final    NO GROWTH 2 DAYS Performed at [facility], [address] [place], [place] [address]    Report Status PENDING  Incomplete     Radiology Studies: No results found.  Scheduled Meds:  enoxaparin (LOVENOX) injection  40 mg Subcutaneous Q24H   ibuprofen  400 mg Oral QID   levETIRAcetam  500 mg Oral BID   OLANZapine zydis  5 mg Oral Daily   And   OLANZapine zydis  10 mg Oral QHS   [START ON 11/19/2020] thiamine injection  100 mg Intravenous Daily   Continuous Infusions:  thiamine injection 250 mg (11/14/20 1349)     LOS: 11 days   Time spent: 43min  Verneita Griffes, MD [facility] 7:27 AM   If 7PM-7AM, please contact night-coverage [email or web address]  11/15/2020, 7:27 AM

## 2020-11-16 DIAGNOSIS — G0481 Other encephalitis and encephalomyelitis: Secondary | ICD-10-CM

## 2020-11-16 LAB — CBC
HCT: 34.4 % — ABNORMAL LOW (ref 39.0–52.0)
Hemoglobin: 10.9 g/dL — ABNORMAL LOW (ref 13.0–17.0)
MCH: 32.2 pg (ref 26.0–34.0)
MCHC: 31.7 g/dL (ref 30.0–36.0)
MCV: 101.5 fL — ABNORMAL HIGH (ref 80.0–100.0)
Platelets: 309 10*3/uL (ref 150–400)
RBC: 3.39 MIL/uL — ABNORMAL LOW (ref 4.22–5.81)
RDW: 12 % (ref 11.5–15.5)
WBC: 7 10*3/uL (ref 4.0–10.5)
nRBC: 0 % (ref 0.0–0.2)

## 2020-11-16 LAB — COMPREHENSIVE METABOLIC PANEL
ALT: 23 U/L (ref 0–44)
AST: 17 U/L (ref 15–41)
Albumin: 3.1 g/dL — ABNORMAL LOW (ref 3.5–5.0)
Alkaline Phosphatase: 73 U/L (ref 38–126)
Anion gap: 7 (ref 5–15)
BUN: 22 mg/dL — ABNORMAL HIGH (ref 6–20)
CO2: 26 mmol/L (ref 22–32)
Calcium: 9.3 mg/dL (ref 8.9–10.3)
Chloride: 107 mmol/L (ref 98–111)
Creatinine, Ser: 0.82 mg/dL (ref 0.61–1.24)
GFR, Estimated: >60 mL/min (ref >60)
Glucose, Bld: 105 mg/dL — ABNORMAL HIGH (ref 70–99)
Potassium: 4 mmol/L (ref 3.5–5.1)
Sodium: 140 mmol/L (ref 135–145)
Total Bilirubin: 0.2 mg/dL — ABNORMAL LOW (ref 0.3–1.2)
Total Protein: 6.3 g/dL — ABNORMAL LOW (ref 6.5–8.1)

## 2020-11-16 LAB — OLIGOCLONAL BANDS, CSF + SERM

## 2020-11-16 LAB — VITAMIN B1: Vitamin B1 (Thiamine): 374.6 nmol/L — ABNORMAL HIGH (ref 66.5–200.0)

## 2020-11-16 NOTE — Progress Notes (Signed)
PROGRESS NOTE    Benjamin [name]  OBS:[id number] DOB: 08/20/1965 DOA: 11/04/2020 PCP: Benjamin Chroman, MD   Brief Narrative:  Benjamin [name] is a 55 y.o. male with medical history significant for seizure disorder who presented to [facility] emergency room with altered mental status.  His wife reported that he had been acting very erratically.  He was complaining of pain in his left foot.  Reportedly he had an injury to his left foot at home in the last day or 2 and has had pain in the foot since then.  He was found to have fracture of his left first toe.  Placed in a postop shoe in the emergency room.  He had an erratic behavior and was very anxious and uncooperative and he was dosed with Haldol Ativan and Benadryl in the emergency room.  Chest x-ray revealed a lingular pneumonia versus a central lung mass.  CT of his chest was obtained which showed a mass in the left lingula and surrounding postobstructive pneumonitis.  The mass extended into the left hilum and he had left hilar adenopathy.  He also had a mass in the right upper lobe.  MRI was obtained to make sure there is no metastasis that would be causing his symptoms.  MRI did not reveal any intracranial lesions.  Due to lack of specialists at [facility] for further work-up so patient was transferred to [facility].  Imaging at outside facility CT chest 11/04/2020 1. There is an apparent mass arising in the lingula with suspected surrounding postobstructive pneumonitis. This mass extends into the left hilum with apparent left hilar adenopathy which is difficult to delineate from the dominant lingular mass. Advise consideration for Pulmonary Medicine consultation for potential bronchoscopy to obtain tissue for further assessment. 2. Small areas of opacity in the right upper lobe, likely foci of neoplasm with adjacent scarring. Nuclear medicine PET study could be helpful for further characterization of the smaller lesions. 3. Areas of  apparent adenopathy in the subcarinal and right hilar regions as well as the larger apparent adenopathy in the left hilar region. 4. Aortic atherosclerosis. There are foci great vessel and coronary artery calcification.  CTA 10/27/2020 IMPRESSION: 1. No acute pulmonary emboli. 2. Unchanged large left hilar/perihilar cavitary necrotic masslike consolidation in left upper lobe. Other cavitary pulmonary nodules bilaterally. Left hilar lymphadenopathy. Findings highly suspicious for malignancy. 3. Gastric wall thickening may represent gastritis or other etiologies. Follow-up recommended.  Assessment & Plan:   Principal Problem:   Encephalopathy acute Active Problems:   Seizure disorder ([place])   Lung neoplasm   Other fracture of left great toe, initial encounter for closed fracture   Encephalopathy   Autoimmune encephalitis  Acute versus subacute encephalopathy vs acute psychotic break - unclear etiology, POA Rule out paraneoplastic limbic encephalopathy -Appreciate oncology, pulmonology and neurology input regarding patient's unspecified lung mass.   -LP 11/12/2020  CSF nonsuggestive of viral/bacterial viral/bacterial infection Oligoclonal bands are negative IgG index shows low albumin IgG and CSF 6.6 CSF paraneoplastic panel per neurology still pending - IV Solu-Medrol to start 6/14 per neurology -B12, TSH within normal limits -continue vitamin B1 as per neurologist - History of seizure with medication noncompliance, EEG negative  - MRI brain unremarkable  Significant behavioral overlay superimposed upon Questionable diagnosis of schizophrenia versus schizoaffective disorder  -Medication management -->psychiatry-he doesn't have capacity --requires reevaluation for capacity TOC will also need to see him to help dispo planning as wife states cannot take care of him -NO Haldol, Ativan  -  Seroquel 50 mg twice daily as needed agitation primarily, Continue Zydis 5 AM, 10  PM  Incidental lung nodule/opacification/mass, POA - CT of chest showed to have nodules concerning for cancer  - Heme-onc/pulmonology/neurology following to rule out paraneoplastic limbic encephalopathy given above -N.p.o. after midnight 6/12 planning for EBUS 6/13 Dr. Erin Fulling pulmonology -I have discussed with nursing staff to make sure that they do not feed him past midnight -Inadvertently the patient was fed for some unclear reason and this is the second time that the patient has had his procedure canceled -Next possible time to do bronchoscopy will be 6/15 per pulmonology  Seizure disorder -Cannot rule out acute breakthrough seizure in the setting of noncompliance, EEG negative and no episodes of seizure-like activity -Keppra transitioned to p.o. continue 500 twice daily   Other fracture of left great toe, initial encounter for closed fracture -Post op shoe was placed in the emergency room and will be continued.   -No further indication for imaging or intervention at this time. Pain well controlled.   DVT prophylaxis:  Lovenox Code Status:  Full code  Family Communication: No family present at the bedside today Called patient's wife Benjamin [name] [phone] on 6/13 No answer left voicemail Status is: Inpatient  Dispo: The patient is from: Home              Anticipated d/c is to: To be determined              Anticipated d/c date is: >72 hours              Patient currently not medically stable for discharge  Consultants:  Psych, pulmonology, neurology, oncology  Procedures:  Lumbar puncture performed 6/9  Antimicrobials:  None indicated  Subjective:  Awake coherent but somewhat confused No new issues  Objective: Vitals:   11/15/20 0537 11/15/20 1335 11/15/20 2054 11/16/20 0559  BP: (!) 154/103 104/82 115/64 101/62  Pulse: (!) 108 81 73 67  Resp:  16 18 17   Temp: 99.4 F (37.4 C) 98.6 F (37 C) 98.7 F (37.1 C) 98.1 F (36.7 C)  TempSrc:  Oral Oral   SpO2: 94% 97%  97% 95%  Weight:      Height:       No intake or output data in the 24 hours ending 11/16/20 0806  Filed Weights   11/04/20 2343 11/12/20 1040  Weight: 51.5 kg 51.5 kg    Examination:  Coherent awake alert no distress EOMI NCAT no focal deficit Chest clear no rales no rhonchi no wheeze Abdomen soft scaphoid no rebound External ocular movements intact power in upper extremities is intact Patient is ambulatory He is quite frail and cachectic  Data Reviewed: I have personally reviewed following labs and imaging studies  CBC: Recent Labs  Lab 11/12/20 0034 11/13/20 0138 11/14/20 0343 11/15/20 0811 11/16/20 0454  WBC 6.5 6.9 7.7 7.8 7.0  HGB 11.2* 12.1* 12.1* 11.9* 10.9*  HCT 34.6* 37.2* 36.4* 37.1* 34.4*  MCV 99.4 99.2 97.8 100.0 101.5*  PLT 224 255 288 315 726    Basic Metabolic Panel: Recent Labs  Lab 11/12/20 0034 11/13/20 0138 11/14/20 0343 11/15/20 0811 11/16/20 0454  NA 139 138 140 140 140  K 3.8 4.3 4.1 3.9 4.0  CL 102 100 103 106 107  CO2 28 28 28 24 26   GLUCOSE 110* 102* 123* 127* 105*  BUN 16 18 20 16  22*  CREATININE 1.04 0.91 0.80 0.75 0.82  CALCIUM 9.1 9.5 9.5 9.6 9.3  GFR: Estimated Creatinine Clearance: 72.4 mL/min (by C-G formula based on SCr of 0.82 mg/dL). Liver Function Tests: Recent Labs  Lab 11/12/20 0034 11/12/20 1230 11/13/20 0138 11/14/20 0343 11/15/20 0811 11/16/20 0454  AST 22  --  20 17 15 17   ALT 30  --  26 24 25 23   ALKPHOS 83  --  80 74 80 73  BILITOT 0.5  --  0.4 0.4 0.3 0.2*  PROT 6.2*  --  6.9 6.8 7.0 6.3*  ALBUMIN 2.9* 4.0 3.4* 3.2* 3.4* 3.1*    No results for input(s): LIPASE, AMYLASE in the last 168 hours. No results for input(s): AMMONIA in the last 168 hours. Coagulation Profile: No results for input(s): INR, PROTIME in the last 168 hours. Cardiac Enzymes: No results for input(s): CKTOTAL, CKMB, CKMBINDEX, TROPONINI in the last 168 hours. BNP (last 3 results) No results for input(s): PROBNP in the  last 8760 hours. HbA1C: No results for input(s): HGBA1C in the last 72 hours. CBG: No results for input(s): GLUCAP in the last 168 hours. Lipid Profile: No results for input(s): CHOL, HDL, LDLCALC, TRIG, CHOLHDL, LDLDIRECT in the last 72 hours. Thyroid Function Tests: No results for input(s): TSH, T4TOTAL, FREET4, T3FREE, THYROIDAB in the last 72 hours.  Anemia Panel: No results for input(s): VITAMINB12, FOLATE, FERRITIN, TIBC, IRON, RETICCTPCT in the last 72 hours.  Sepsis Labs: No results for input(s): PROCALCITON, LATICACIDVEN in the last 168 hours.  Recent Results (from the past 240 hour(s))  CSF culture w Gram Stain     Status: None   Collection Time: 11/12/20 12:20 PM   Specimen: CSF; Cerebrospinal Fluid  Result Value Ref Range Status   Specimen Description CSF  Final   Special Requests LP  Final   Gram Stain   Final    WBC PRESENT, PREDOMINANTLY MONONUCLEAR NO ORGANISMS SEEN CYTOSPIN SMEAR    Culture   Final    NO GROWTH 3 DAYS Performed at [facility], [address] [place], [place] [address]    Report Status 11/15/2020 FINAL  Final     Radiology Studies: No results found.  Scheduled Meds:  enoxaparin (LOVENOX) injection  40 mg Subcutaneous Q24H   ibuprofen  400 mg Oral QID   levETIRAcetam  500 mg Oral BID   OLANZapine zydis  5 mg Oral Daily   And   OLANZapine zydis  10 mg Oral QHS   [START ON 11/19/2020] thiamine injection  100 mg Intravenous Daily   Continuous Infusions:  thiamine injection 250 mg (11/15/20 1018)     LOS: 12 days   Time spent: 87min  Verneita Griffes, MD [facility] 8:06 AM   If 7PM-7AM, please contact night-coverage [email or web address]  11/16/2020, 8:06 AM
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
## 2020-11-16 NOTE — Progress Notes (Signed)
Pt refusing to be stuck for IV. IV team came up and stated pt not complying and refusing.
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
## 2020-11-16 NOTE — Progress Notes (Signed)
Spoke with patient's floor RN and she stated patient consumed approximately 2 bites of a graham cracker. Anesthesia MD made aware and stated patient to wait approximately 6 hours for procedure; understanding verbalized. Endo desk also made aware.

## 2020-11-16 NOTE — Progress Notes (Signed)
Bronch rescheduled for Wednesday AM in endoscopy due to eating prior to his procedure. NPO past midnight Tuesday.  Benjamin Hy, DO 11/16/20 3:51 PM [place] [facility]

## 2020-11-17 DIAGNOSIS — E43 Unspecified severe protein-calorie malnutrition: Secondary | ICD-10-CM

## 2020-11-17 LAB — CBC WITH DIFFERENTIAL/PLATELET
Abs Immature Granulocytes: 0.02 10*3/uL (ref 0.00–0.07)
Basophils Absolute: 0.1 10*3/uL (ref 0.0–0.1)
Basophils Relative: 1 %
Eosinophils Absolute: 0.3 10*3/uL (ref 0.0–0.5)
Eosinophils Relative: 4 %
HCT: 33.4 % — ABNORMAL LOW (ref 39.0–52.0)
Hemoglobin: 10.7 g/dL — ABNORMAL LOW (ref 13.0–17.0)
Immature Granulocytes: 0 %
Lymphocytes Relative: 22 %
Lymphs Abs: 1.6 10*3/uL (ref 0.7–4.0)
MCH: 32.3 pg (ref 26.0–34.0)
MCHC: 32 g/dL (ref 30.0–36.0)
MCV: 100.9 fL — ABNORMAL HIGH (ref 80.0–100.0)
Monocytes Absolute: 0.8 10*3/uL (ref 0.1–1.0)
Monocytes Relative: 11 %
Neutro Abs: 4.6 10*3/uL (ref 1.7–7.7)
Neutrophils Relative %: 62 %
Platelets: 300 10*3/uL (ref 150–400)
RBC: 3.31 MIL/uL — ABNORMAL LOW (ref 4.22–5.81)
RDW: 11.9 % (ref 11.5–15.5)
WBC: 7.5 10*3/uL (ref 4.0–10.5)
nRBC: 0 % (ref 0.0–0.2)

## 2020-11-17 LAB — COMPREHENSIVE METABOLIC PANEL
ALT: 19 U/L (ref 0–44)
AST: 14 U/L — ABNORMAL LOW (ref 15–41)
Albumin: 3.2 g/dL — ABNORMAL LOW (ref 3.5–5.0)
Alkaline Phosphatase: 72 U/L (ref 38–126)
Anion gap: 8 (ref 5–15)
BUN: 23 mg/dL — ABNORMAL HIGH (ref 6–20)
CO2: 29 mmol/L (ref 22–32)
Calcium: 9.1 mg/dL (ref 8.9–10.3)
Chloride: 101 mmol/L (ref 98–111)
Creatinine, Ser: 0.85 mg/dL (ref 0.61–1.24)
GFR, Estimated: >60 mL/min (ref >60)
Glucose, Bld: 109 mg/dL — ABNORMAL HIGH (ref 70–99)
Potassium: 3.9 mmol/L (ref 3.5–5.1)
Sodium: 138 mmol/L (ref 135–145)
Total Bilirubin: 0.3 mg/dL (ref 0.3–1.2)
Total Protein: 6.5 g/dL (ref 6.5–8.1)

## 2020-11-17 LAB — MISC LABCORP TEST (SEND OUT): Labcorp test code: 813940

## 2020-11-17 NOTE — H&P (View-Only) (Signed)
Brief progress note: I attempted to call the patient's spouse Joseh Sjogren to obtain consent for EBUS. Bronch and biopsy scheduled for 6/15. There was no answer. We will have nursing staff continue to try to reach out to patient's wife to obtain consent, as the patient is unable to do so.
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
## 2020-11-17 NOTE — Progress Notes (Signed)
Brief progress note: I attempted to call the patient's spouse Montrae Braithwaite to obtain consent for EBUS. Bronch and biopsy scheduled for 6/15. There was no answer. We will have nursing staff continue to try to reach out to patient's wife to obtain consent, as the patient is unable to do so.

## 2020-11-17 NOTE — Progress Notes (Addendum)
[facility] PROGRESS NOTE  Benjamin [name] MWN:[id number] DOB: 1966-04-03 DOA: 11/04/2020 PCP: Glenda Chroman, MD  Status: Remains inpatient appropriate because:Altered mental status, Ongoing diagnostic testing needed not appropriate for outpatient work up, Unsafe d/c plan, and IV treatments appropriate due to intensity of illness or inability to take PO  Dispo: The patient is from: Home              Anticipated d/c is to: SNF-wife reports that prior to admission his behaviors have been erratic and at times violent and she does not feel safe at this time with patient returning home in current state              Patient currently is not medically stable to d/c.   Difficult to place patient Yes   Level of care: Telemetry Medical  Code Status: Full Family Communication:  DVT prophylaxis: Lovenox COVID vaccination status: Unknown    HPI: 55 y.o. male with medical history significant for seizure disorder who presented to [facility] emergency room with altered mental status.  His wife reported that he had been acting very erratically.  He was complaining of pain in his left foot.  Reportedly he had an injury to his left foot at home in the last day or 2 and has had pain in the foot since then.  He was found to have fracture of his left first toe.  Placed in a postop shoe in the emergency room.  He had an erratic behavior and was very anxious and uncooperative and he was dosed with Haldol Ativan and Benadryl in the emergency room.  Chest x-ray revealed a lingular pneumonia versus a central lung mass.  CT of his chest was obtained which showed a mass in the left lingula and surrounding postobstructive pneumonitis.  The mass extended into the left hilum and he had left hilar adenopathy.  He also had a mass in the right upper lobe.  MRI was obtained to make sure there is no metastasis that would be causing his symptoms.  MRI did not reveal any intracranial lesions.  Due to lack of specialists at  [facility] for further work-up so patient was transferred to [facility].  Subjective: Alert and sitting on side of bed.  No specific complaints.  Confirms he is to undergo EBUS procedure today.  Objective: Vitals:   11/16/20 1915 11/17/20 0445  BP: 123/85 110/84  Pulse: 85 82  Resp:    Temp: 97.7 F (36.5 C) 97.9 F (36.6 C)  SpO2: 96% 96%   No intake or output data in the 24 hours ending 11/17/20 0829 Filed Weights   11/04/20 2343 11/12/20 1040  Weight: 51.5 kg 51.5 kg    Exam:  Constitutional: NAD, calm, comfortable Respiratory: clear to auscultation bilaterally, no wheezing, no crackles. Normal respiratory effort. No accessory muscle use.  Cardiovascular: Regular rate and attaining sinus rhythm, no murmurs / rubs / gallops. No extremity edema. 2+ pedal pulses.  Abdomen: no tenderness, no masses palpated. Bowel sounds positive. LBM 6/10 Neurologic: CN 2-12 grossly intact. Sensation intact, DTR normal. Strength 4/5 x all 4 extremities.  Psychiatric: Alert and oriented times name place and year but not to month.  Able to perform simple addition.  Also continues to demonstrate impulsivity   Assessment/Plan: Acute problems: Acute versus subacute encephalopathy vs acute psychotic break - unclear etiology, POA Rule out paraneoplastic limbic encephalopathy versus autoimmune encephalitis -No further behaviors consistent with acute psychosis -Serological work-up has been negative -LP completed with  results pending-he does have low albumin IgG and there is no infectious etiology identified based on CSF -Psychiatry reconsulted given patient's impulsiveness; previously documented patient does not have capacity but needs reevaluation according to attending physician -Oncology, pulmonology and neurology have been consulted -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 -Plan is to pursue EBUS and IVIG immune encephalitis -B-1/thiamine low from a  neurological standpoint therefore has been initiated on high dose IV thiamine for 6 doses followed by 100 mg IV daily - History of seizure with medication noncompliance, EEG negative - MRI brain unremarkable -Continue Seroquel and Zyprexa-check EKG for QTC measurement   Incidental lung nodule/opacification/mass, POA - On routine imaging CT of chest demonstrated nodules concerning for cancer - Heme-onc/pulmonology/neurology following to rule out paraneoplastic limbic encephalopathy  -Staging EBUS pending   Seizure disorder -No evidence of breakthrough seizures on EEG since admission -Continue Keppra IV per neurology -Apparently was noncompliant with Keppra prior to admission   Other fracture of left great toe, initial encounter for closed fracture Post op shoe was placed in the emergency room and will be continued.   Able to ambulate independently  Severe protein calorie malnutrition Body mass index is 17.78 kg/m.  1 year ago BMI was 18.3 with a body weight of 120 pounds Nutrition consultation    Data Reviewed: Basic Metabolic Panel: Recent Labs  Lab 11/13/20 0138 11/14/20 0343 11/15/20 0811 11/16/20 0454 11/17/20 0049  NA 138 140 140 140 138  K 4.3 4.1 3.9 4.0 3.9  CL 100 103 106 107 101  CO2 28 28 24 26 29   GLUCOSE 102* 123* 127* 105* 109*  BUN 18 20 16  22* 23*  CREATININE 0.91 0.80 0.75 0.82 0.85  CALCIUM 9.5 9.5 9.6 9.3 9.1   Liver Function Tests: Recent Labs  Lab 11/13/20 0138 11/14/20 0343 11/15/20 0811 11/16/20 0454 11/17/20 0049  AST 20 17 15 17  14*  ALT 26 24 25 23 19   ALKPHOS 80 74 80 73 72  BILITOT 0.4 0.4 0.3 0.2* 0.3  PROT 6.9 6.8 7.0 6.3* 6.5  ALBUMIN 3.4* 3.2* 3.4* 3.1* 3.2*   No results for input(s): LIPASE, AMYLASE in the last 168 hours. No results for input(s): AMMONIA in the last 168 hours. CBC: Recent Labs  Lab 11/13/20 0138 11/14/20 0343 11/15/20 0811 11/16/20 0454 11/17/20 0049  WBC 6.9 7.7 7.8 7.0 7.5  NEUTROABS  --   --    --   --  4.6  HGB 12.1* 12.1* 11.9* 10.9* 10.7*  HCT 37.2* 36.4* 37.1* 34.4* 33.4*  MCV 99.2 97.8 100.0 101.5* 100.9*  PLT 255 288 315 309 300   Cardiac Enzymes: No results for input(s): CKTOTAL, CKMB, CKMBINDEX, TROPONINI in the last 168 hours. BNP (last 3 results) No results for input(s): BNP in the last 8760 hours.  ProBNP (last 3 results) No results for input(s): PROBNP in the last 8760 hours.  CBG: No results for input(s): GLUCAP in the last 168 hours.  Recent Results (from the past 240 hour(s))  CSF culture w Gram Stain     Status: None   Collection Time: 11/12/20 12:20 PM   Specimen: CSF; Cerebrospinal Fluid  Result Value Ref Range Status   Specimen Description CSF  Final   Special Requests LP  Final   Gram Stain   Final    WBC PRESENT, PREDOMINANTLY MONONUCLEAR NO ORGANISMS SEEN CYTOSPIN SMEAR    Culture   Final    NO GROWTH 3 DAYS Performed at [facility]  Lab, [address] [place], [place] [address]    Report Status 11/15/2020 FINAL  Final     Studies: No results found.  Scheduled Meds:  enoxaparin (LOVENOX) injection  40 mg Subcutaneous Q24H   ibuprofen  400 mg Oral QID   levETIRAcetam  500 mg Oral BID   OLANZapine zydis  5 mg Oral Daily   And   OLANZapine zydis  10 mg Oral QHS   [START ON 11/19/2020] thiamine injection  100 mg Intravenous Daily   Continuous Infusions:  thiamine injection 250 mg (11/15/20 1018)    Principal Problem:   Encephalopathy acute Active Problems:   Seizure disorder ([place])   Lung neoplasm   Other fracture of left great toe, initial encounter for closed fracture   Encephalopathy   Autoimmune encephalitis   Consultants: Psychiatry Pulmonary medicine Neurology Oncology  Procedures: Lumbar puncture performed 6/9  Antibiotics: None   Time spent: 5 minutes    Erin Hearing ANP  [facility] 7 am - 330 pm/M-F for direct patient care and secure chat Please refer to Amion for contact info 13   days

## 2020-11-17 NOTE — TOC Initial Note (Signed)
Transition of Care [facility]) - Initial/Assessment Note    Patient Details  Name: Benjamin [name] MRN: [id number] Date of Birth: 09-22-1965  Transition of Care [facility]) CM/SW Contact:    Curlene Labrum, RN Phone Number: 11/17/2020, 1:38 PM  Clinical Narrative:                 CM met with the patient at the bedside and patient placed on DTP Team for transitions of care.  The patient is currently not medically ready for discharge and is waiting for EBUS and Lung Biopsy with IR.  The patient states that he wants to feel better and currently complains of a head ache.  The patient was admitted for paranoid psychosis, potential para neoplasm syndrome and new lung mass.  The patient was living in a trailer at home with his wife and developed erratic behavior and was tearing up the home per MD notes and the patient's wife did not feel safe having him return home.  I called and was unable to reach the wife by phone.  MD notes request SNF placement once the patient is medically stable for discharge at this point.  The patient has not been vaccinated for COVID.  CM and MSW will continue to follow the patient for transitions of care needs and possible SNf placement - but patient is currently not medically stable.  Expected Discharge Plan: Skilled Nursing Facility Barriers to Discharge: Continued Medical Work up   Patient Goals and CMS Choice Patient states their goals for this hospitalization and ongoing recovery are:: Patient waiting for EBUS and wife requesting SNF placement at this time. CMS Medicare.gov Compare Post Acute Care list provided to:: Patient    Expected Discharge Plan and Services Expected Discharge Plan: [facility] In-house Referral: Clinical Social Work Discharge Planning Services: CM Consult Post Acute Care Choice: [place] arrangements for the past 2 months: Mobile Home                                      Prior Living  Arrangements/Services Living arrangements for the past 2 months: Mobile Home Lives with:: Spouse Patient language and need for interpreter reviewed:: Yes Do you feel safe going back to the place where you live?: No   Wife states that the patient has been combative, confused at home.  Need for Family Participation in Patient Care: Yes (Comment) Care giver support system in place?: Yes (comment)   Criminal Activity/Legal Involvement Pertinent to Current Situation/Hospitalization: No - Comment as needed  Activities of Daily Living   ADL Screening (condition at time of admission) Patient's cognitive ability adequate to safely complete daily activities?: No Is the patient deaf or have difficulty hearing?: No Does the patient have difficulty seeing, even when wearing glasses/contacts?: No Does the patient have difficulty concentrating, remembering, or making decisions?: Yes Patient able to express need for assistance with ADLs?: Yes Does the patient have difficulty dressing or bathing?: No Independently performs ADLs?: No Communication: Independent Dressing (OT): Independent Grooming: Independent Feeding: Independent Bathing: Needs assistance Is this a change from baseline?: Change from baseline, expected to last <3 days Toileting: Independent In/Out Bed: Independent Walks in Home: Independent Does the patient have difficulty walking or climbing stairs?: Yes Weakness of Legs: Both Weakness of Arms/Hands: None  Permission Sought/Granted Permission sought to share information with : Case Manager, Family Supports, Customer service manager Permission granted to share information  with : Yes, Verbal Permission Granted        Permission granted to share info w Relationship: wife - Benjamin [name] - [phone]     Emotional Assessment Appearance:: Appears stated age Attitude/Demeanor/Rapport: Inconsistent Affect (typically observed): Anxious Orientation: : Oriented to Self, Oriented  to Place Alcohol / Substance Use: Tobacco Use Psych Involvement: Yes (comment)  Admission diagnosis:  Encephalopathy [G93.40] Patient Active Problem List   Diagnosis Date Noted   Severe protein-calorie malnutrition ([place]) 11/17/2020   Autoimmune encephalitis    Encephalopathy acute 11/05/2020   Lung neoplasm 11/05/2020   Other fracture of left great toe, initial encounter for closed fracture 11/05/2020   Encephalopathy 11/05/2020   Seizure disorder ([place]) 06/01/2017   Headache 06/01/2017   PCP:  Glenda Chroman, MD Pharmacy:   [facility] [place] [address] [place] [address] Phone: [phone] Fax: [phone]     Social Determinants of Health (SDOH) Interventions    Readmission Risk Interventions No flowsheet data found.

## 2020-11-17 NOTE — Progress Notes (Signed)
Attempted to obtain telephone consent for Bronchoscopy 11/18/20. Left VM for wife [name] to return call.

## 2020-11-18 ENCOUNTER — Inpatient Hospital Stay (HOSPITAL_COMMUNITY): Payer: Medicaid Other | Admitting: Anesthesiology

## 2020-11-18 ENCOUNTER — Encounter (HOSPITAL_COMMUNITY)
Admission: AD | Disposition: A | Discharge status: Hospice | Payer: Self-pay | Source: Other Acute Inpatient Hospital | Attending: Internal Medicine

## 2020-11-18 ENCOUNTER — Encounter (HOSPITAL_COMMUNITY): Payer: Self-pay | Admitting: Family Medicine

## 2020-11-18 DIAGNOSIS — D491 Neoplasm of unspecified behavior of respiratory system: Secondary | ICD-10-CM

## 2020-11-18 HISTORY — PX: BRONCHIAL BRUSHINGS: SHX5108

## 2020-11-18 HISTORY — PX: BRONCHIAL WASHINGS: SHX5105

## 2020-11-18 HISTORY — PX: VIDEO BRONCHOSCOPY WITH ENDOBRONCHIAL ULTRASOUND: SHX6177

## 2020-11-18 HISTORY — PX: BRONCHIAL NEEDLE ASPIRATION BIOPSY: SHX5106

## 2020-11-18 LAB — CBC
HCT: 31.9 % — ABNORMAL LOW (ref 39.0–52.0)
Hemoglobin: 10.2 g/dL — ABNORMAL LOW (ref 13.0–17.0)
MCH: 32.5 pg (ref 26.0–34.0)
MCHC: 32 g/dL (ref 30.0–36.0)
MCV: 101.6 fL — ABNORMAL HIGH (ref 80.0–100.0)
Platelets: 312 10*3/uL (ref 150–400)
RBC: 3.14 MIL/uL — ABNORMAL LOW (ref 4.22–5.81)
RDW: 12 % (ref 11.5–15.5)
WBC: 7.4 10*3/uL (ref 4.0–10.5)
nRBC: 0 % (ref 0.0–0.2)

## 2020-11-18 LAB — BASIC METABOLIC PANEL
Anion gap: 10 (ref 5–15)
BUN: 37 mg/dL — ABNORMAL HIGH (ref 6–20)
CO2: 28 mmol/L (ref 22–32)
Calcium: 9.6 mg/dL (ref 8.9–10.3)
Chloride: 103 mmol/L (ref 98–111)
Creatinine, Ser: 0.89 mg/dL (ref 0.61–1.24)
GFR, Estimated: >60 mL/min (ref >60)
Glucose, Bld: 109 mg/dL — ABNORMAL HIGH (ref 70–99)
Potassium: 4.1 mmol/L (ref 3.5–5.1)
Sodium: 141 mmol/L (ref 135–145)

## 2020-11-18 LAB — PROTIME-INR
INR: 1 (ref 0.8–1.2)
Prothrombin Time: 13.3 seconds (ref 11.4–15.2)

## 2020-11-18 SURGERY — BRONCHOSCOPY, WITH EBUS
Anesthesia: General

## 2020-11-18 MED ORDER — FENTANYL CITRATE (PF) 250 MCG/5ML IJ SOLN
INTRAMUSCULAR | Status: DC | PRN
  Administered 2020-11-18: 100 ug via INTRAVENOUS

## 2020-11-18 MED ORDER — ONDANSETRON HCL 4 MG/2ML IJ SOLN
INTRAMUSCULAR | Status: DC | PRN
  Administered 2020-11-18: 4 mg via INTRAVENOUS

## 2020-11-18 MED ORDER — PHENYLEPHRINE HCL-NACL 10-0.9 MG/250ML-% IV SOLN
INTRAVENOUS | Status: DC | PRN
  Administered 2020-11-18: 50 ug/min via INTRAVENOUS

## 2020-11-18 MED ORDER — SUGAMMADEX SODIUM 200 MG/2ML IV SOLN
INTRAVENOUS | Status: DC | PRN
  Administered 2020-11-18: 200 mg via INTRAVENOUS

## 2020-11-18 MED ORDER — PROPOFOL 10 MG/ML IV BOLUS
INTRAVENOUS | Status: DC | PRN
  Administered 2020-11-18: 30 mg via INTRAVENOUS
  Administered 2020-11-18: 50 mg via INTRAVENOUS
  Administered 2020-11-18: 20 mg via INTRAVENOUS

## 2020-11-18 MED ORDER — ROCURONIUM BROMIDE 10 MG/ML (PF) SYRINGE
PREFILLED_SYRINGE | INTRAVENOUS | Status: DC | PRN
  Administered 2020-11-18: 50 mg via INTRAVENOUS

## 2020-11-18 MED ORDER — LIDOCAINE 2% (20 MG/ML) 5 ML SYRINGE
INTRAMUSCULAR | Status: DC | PRN
  Administered 2020-11-18: 50 mg via INTRAVENOUS

## 2020-11-18 MED ORDER — LACTATED RINGERS IV SOLN: INTRAVENOUS | Status: DC

## 2020-11-18 MED ORDER — EPHEDRINE SULFATE-NACL 50-0.9 MG/10ML-% IV SOSY
PREFILLED_SYRINGE | INTRAVENOUS | Status: DC | PRN
  Administered 2020-11-18: 10 mg via INTRAVENOUS
  Administered 2020-11-18: 15 mg via INTRAVENOUS
  Administered 2020-11-18: 10 mg via INTRAVENOUS

## 2020-11-18 MED ORDER — FENTANYL CITRATE (PF) 100 MCG/2ML IJ SOLN
INTRAMUSCULAR | Status: AC
  Filled 2020-11-18: qty 2

## 2020-11-18 MED ORDER — PHENYLEPHRINE 40 MCG/ML (10ML) SYRINGE FOR IV PUSH (FOR BLOOD PRESSURE SUPPORT)
PREFILLED_SYRINGE | INTRAVENOUS | Status: DC | PRN
  Administered 2020-11-18 (×3): 200 ug via INTRAVENOUS

## 2020-11-18 NOTE — Anesthesia Postprocedure Evaluation (Signed)
Anesthesia Post Note  Patient: Benjamin [name]  Procedure(s) Performed: VIDEO BRONCHOSCOPY WITH ENDOBRONCHIAL ULTRASOUND BRONCHIAL BRUSHINGS BRONCHIAL WASHINGS BRONCHIAL NEEDLE ASPIRATION BIOPSIES     Patient location during evaluation: Endoscopy Anesthesia Type: General Level of consciousness: awake and alert Pain management: pain level controlled Vital Signs Assessment: post-procedure vital signs reviewed and stable Respiratory status: spontaneous breathing, nonlabored ventilation, respiratory function stable and patient connected to nasal cannula oxygen Cardiovascular status: blood pressure returned to baseline and stable Postop Assessment: no apparent nausea or vomiting Anesthetic complications: no   No notable events documented.  Last Vitals:  Vitals:   11/18/20 1140 11/18/20 1148  BP: 102/60 108/67  Pulse: 90 78  Resp:  20  Temp:  36.7 C  SpO2: 95% 95%    Last Pain:  Vitals:   11/18/20 1140  TempSrc:   PainSc: 0-No pain                 Julita Ozbun L Bharath Bernstein

## 2020-11-18 NOTE — Anesthesia Procedure Notes (Signed)
Procedure Name: Intubation Date/Time: 11/18/2020 9:39 AM Performed by: Lance Coon, CRNA Pre-anesthesia Checklist: Patient identified, Timeout performed, Patient being monitored, Emergency Drugs available and Suction available Patient Re-evaluated:Patient Re-evaluated prior to induction Oxygen Delivery Method: Circle system utilized Preoxygenation: Pre-oxygenation with 100% oxygen Induction Type: IV induction Ventilation: Mask ventilation without difficulty Laryngoscope Size: Miller and 3 Grade View: Grade I Tube type: Oral Tube size: 8.5 mm Number of attempts: 1 Airway Equipment and Method: Stylet Placement Confirmation: ETT inserted through vocal cords under direct vision, positive ETCO2 and breath sounds checked- equal and bilateral Secured at: 21 cm Tube secured with: Tape Dental Injury: Teeth and Oropharynx as per pre-operative assessment

## 2020-11-18 NOTE — Op Note (Signed)
Video Bronchoscopy with Endobronchial Ultrasound Procedure Note  Date of Operation: 11/18/2020  Pre-op Diagnosis: L lung mass  Post-op Diagnosis: L lung mass  Surgeon: [name]  Assistants:   Anesthesia: heavy sedation, see separate anesthesia record  Operation: Flexible video fiberoptic bronchoscopy with endobronchial ultrasound and biopsies.  Estimated Blood Loss: Minimal  Complications: none  Indications and History: Benjamin [name] is a 55 y.o. male with a history of left lung mass, tobacco abuse.  The risks, benefits, complications, treatment options and expected outcomes were discussed with the patient.  The possibilities of pneumothorax, pneumonia, reaction to medication, pulmonary aspiration, perforation of a viscus, bleeding, failure to diagnose a condition, and creating a complication requiring transfusion or operation were discussed with the patient who freely signed the consent. All questions were answered. Wife provided consent for patient.  Description of Procedure: The patient was examined in the preoperative area and history and data from the preprocedure consultation were reviewed. It was deemed appropriate to proceed.  The patient was taken to endoscopy, identified as Benjamin [name] with 1965-10-05 and [id number], and the procedure verified as Flexible Video Fiberoptic Bronchoscopy.  A Time Out was held and the above information confirmed. After being taken to the operating room general anesthesia was initiated and the patient  was orally intubated. The video fiberoptic bronchoscope was introduced via the endotracheal tube and a general inspection was performed which showed left lung mass at the left primary carina extending into the left upper lobe and LLL bronchi. Endobronchial biopsies, brush biopsies, and bronchial wash were performed. The standard scope was then withdrawn and the endobronchial ultrasound was used to identify and characterize the peratracheal, hilar, and  bronchial lymph nodes. Inspection showed <1cm nodes in 4R, station 7. These nodes were not biopsied. Using real-time ultrasound guidance, Wang needle biopsies were take from [name] where there was a >3cm mass. These speimens were bloody. 5 passes were taken. This was sent for cytology. The patient tolerated the procedure well without apparent complications. There was no significant blood loss. The bronchoscope was withdrawn. Anesthesia was reversed, and the patient was taken to the PACU for recovery.   Samples: 1. Endobronchial biopsy from left carina/ L mainstem bronchus 2. Brush biopsy left carina/ L mainstem bronchus 3. Bronchial wash of left carina/ L mainstem bronchus 4. Wang needle biopsies from L lung mass from station 10L and 11L locations   Plans:  The patient will be discharged from the PACU to home when recovered from anesthesia. We will review the cytology, pathology and microbiology results with the patient when they become available. I will follow up with cytology results when they have resulted.   [name], DO 11/18/20 11:12 AM [facility]

## 2020-11-18 NOTE — Interval H&P Note (Signed)
History and Physical Interval Note:  11/18/2020 8:12 AM  Benjamin [name]  has presented today for surgery, with the diagnosis of mediastinal adenopthy.  The various methods of treatment have been discussed with the patient and family. After consideration of risks, benefits and other options for treatment, the patient has consented to  Procedure(s): [place] (N/A) as a surgical intervention.  The patient's history has been reviewed, patient examined, no change in status, stable for surgery.  I have reviewed the patient's chart and labs.  Questions were answered to the patient's satisfaction.    Benjamin [name] seen and examined at bedside this morning. His only complaint is of a headache. He has been walking around and frequently wanders into the hall.  BP 129/79 (BP Location: Left Arm)   Pulse 68   Temp 98 F (36.7 C)   Resp 18   Ht 5\' 7"  (1.702 m)   Wt 51.5 kg   SpO2 98%   BMI 17.78 kg/m  Chronically ill, cachectic appearing man sitting up in the recliner [place]/AT, eyes anicteric Breathing comfortable on RA, CTAB. No tachypnea. S1S2, RRR Abd soft, NT No LE edema, no cyanosis Awake, alert, moving all extremities. Gait unchanged, no abnormalities. No tremors noted. Picking at IV but redirectable. No attention to internal stimulation. Skin warm, dry, no rashes or bruising.   Discussed the procedure with his wife Benjamin [name] over the phone and had consent witnessed by RN. All questions were answered. Planning to proceed to EBUS with transbronchial and possibly endobronchial biopsy.    [name]

## 2020-11-18 NOTE — Transfer of Care (Signed)
Immediate Anesthesia Transfer of Care Note  Patient: Benjamin [name]  Procedure(s) Performed: VIDEO BRONCHOSCOPY WITH ENDOBRONCHIAL ULTRASOUND BRONCHIAL BRUSHINGS BRONCHIAL WASHINGS BRONCHIAL NEEDLE ASPIRATION BIOPSIES  Patient Location: Endoscopy Unit  Anesthesia Type:General  Level of Consciousness: drowsy and patient cooperative  Airway & Oxygen Therapy: Patient Spontanous Breathing  Post-op Assessment: Report given to RN and Post -op Vital signs reviewed and stable  Post vital signs: Reviewed and stable  Last Vitals:  Vitals Value Taken Time  BP    Temp    Pulse 82 11/18/20 1120  Resp 16 11/18/20 1120  SpO2 100 % 11/18/20 1120  Vitals shown include unvalidated device data.  Last Pain:  Vitals:   11/18/20 0901  TempSrc: Oral  PainSc: 0-No pain      Patients Stated Pain Goal: 0 (46/65/99 3570)  Complications: No notable events documented.

## 2020-11-18 NOTE — Progress Notes (Addendum)
[facility] PROGRESS NOTE  Athens Lebeau XTK:[id number] DOB: 1965/12/23 DOA: 11/04/2020 PCP: Glenda Chroman, MD  Status: Remains inpatient appropriate because:Altered mental status, Ongoing diagnostic testing needed not appropriate for outpatient work up, Unsafe d/c plan, and IV treatments appropriate due to intensity of illness or inability to take PO  Dispo: The patient is from: Home              Anticipated d/c is to: SNF-wife reports that prior to admission his behaviors have been erratic and at times violent and she does not feel safe at this time with patient returning home in current state              Patient currently is not medically stable to d/c.   Difficult to place patient Yes   Level of care: Telemetry Medical  Code Status: Full Family Communication:  DVT prophylaxis: Lovenox COVID vaccination status: Unknown    HPI: 55 y.o. male with medical history significant for seizure disorder who presented to [facility] emergency room with altered mental status.  His wife reported that he had been acting very erratically.  He was complaining of pain in his left foot.  Reportedly he had an injury to his left foot at home in the last day or 2 and has had pain in the foot since then.  He was found to have fracture of his left first toe.  Placed in a postop shoe in the emergency room.  He had an erratic behavior and was very anxious and uncooperative and he was dosed with Haldol Ativan and Benadryl in the emergency room.  Chest x-ray revealed a lingular pneumonia versus a central lung mass.  CT of his chest was obtained which showed a mass in the left lingula and surrounding postobstructive pneumonitis.  The mass extended into the left hilum and he had left hilar adenopathy.  He also had a mass in the right upper lobe.  MRI was obtained to make sure there is no metastasis that would be causing his symptoms.  MRI did not reveal any intracranial lesions.  Due to lack of specialists at  [facility] for further work-up so patient was transferred to [facility].  Subjective: Alert and sitting up in chair requesting something to eat.  He is post bronchoscopy.  Denies sore throat  Objective: Vitals:   11/17/20 2026 11/18/20 0523  BP: 128/87 129/79  Pulse: 100 68  Resp: 18 18  Temp: 98.2 F (36.8 C) 98 F (36.7 C)  SpO2: 96% 98%    Intake/Output Summary (Last 24 hours) at 11/18/2020 0738 Last data filed at 11/17/2020 1000 Gross per 24 hour  Intake 290 ml  Output --  Net 290 ml   Filed Weights   11/04/20 2343 11/12/20 1040  Weight: 51.5 kg 51.5 kg    Exam:  Constitutional: Alert, no acute distress-cachectic in appearance Respiratory: Bilateral lung sounds are clear to auscultation anteriorly.  Remained stable on room air.  O2 sats 95 to 98% Cardiovascular: S1-S2, normotensive, no tachycardia, no peripheral edema Abdomen: LBM 6/10, abdomen soft and flat with normoactive bowel sounds.  Eating 100% of meals. Neurologic: CN 2-12 grossly intact. Sensation intact, DTR normal. Strength 4/5 x all 4 extremities.  Psychiatric: Alert and oriented x3.  Extremely flat affect.  Mains impulsive   Assessment/Plan: Acute problems: Acute versus subacute encephalopathy vs acute psychotic break - unclear etiology, POA Rule out paraneoplastic limbic encephalopathy versus autoimmune encephalitis -No further behaviors consistent with acute psychosis -Serological work-up  has been negative -LP completed with results pending-he does have low albumin IgG and there is no infectious etiology identified based on CSF -Psychiatry reconsulted given patient's impulsiveness; previously documented patient does not have capacity but needs reevaluation according to attending physician -Oncology, pulmonology and neurology have been consulted -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 -EBUS of left lung mass completed on 6/15.  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 -B-1/thiamine low from a neurological standpoint therefore has been initiated on high dose IV thiamine for 6 doses followed by 100 mg IV daily - History of seizure with medication noncompliance, EEG negative - MRI brain unremarkable -Continue Seroquel and Zyprexa-check EKG for QTC measurement -Continue telemetry safety monitoring for sitter due to ongoing issues with impulsivity and wandering   Incidental lung nodule/opacification/mass, POA - On routine imaging CT of chest demonstrated nodules concerning for cancer - Heme-onc/pulmonology/neurology following to rule out paraneoplastic limbic encephalopathy  -Staging EBUS completed 6/15   Seizure disorder -No evidence of breakthrough seizures on EEG since admission -Continue Keppra IV per neurology -Apparently was noncompliant with Keppra prior to admission   Other fracture of left great toe, initial encounter for closed fracture Post op shoe was placed in the emergency room and will be continued.   Able to ambulate independently  Severe protein calorie malnutrition Body mass index is 17.78 kg/m.  1 year ago BMI was 18.3 with a body weight of 120 pounds Nutrition consultation pending Appetite has picked up over the past 72 hours    Data Reviewed: Basic Metabolic Panel: Recent Labs  Lab 11/14/20 0343 11/15/20 0811 11/16/20 0454 11/17/20 0049 11/18/20 0333  NA 140 140 140 138 141  K 4.1 3.9 4.0 3.9 4.1  CL 103 106 107 101 103  CO2 28 24 26 29 28   GLUCOSE 123* 127* 105* 109* 109*  BUN 20 16 22* 23* 37*  CREATININE 0.80 0.75 0.82 0.85 0.89  CALCIUM 9.5 9.6 9.3 9.1 9.6   Liver Function Tests: Recent Labs  Lab 11/13/20 0138 11/14/20 0343 11/15/20 0811 11/16/20 0454 11/17/20 0049  AST 20 17 15 17  14*  ALT 26 24 25 23 19   ALKPHOS 80 74 80 73 72  BILITOT 0.4 0.4 0.3 0.2* 0.3  PROT 6.9 6.8 7.0 6.3* 6.5  ALBUMIN 3.4* 3.2* 3.4* 3.1* 3.2*   No results for  input(s): LIPASE, AMYLASE in the last 168 hours. No results for input(s): AMMONIA in the last 168 hours. CBC: Recent Labs  Lab 11/14/20 0343 11/15/20 0811 11/16/20 0454 11/17/20 0049 11/18/20 0333  WBC 7.7 7.8 7.0 7.5 7.4  NEUTROABS  --   --   --  4.6  --   HGB 12.1* 11.9* 10.9* 10.7* 10.2*  HCT 36.4* 37.1* 34.4* 33.4* 31.9*  MCV 97.8 100.0 101.5* 100.9* 101.6*  PLT 288 315 309 300 312   Cardiac Enzymes: No results for input(s): CKTOTAL, CKMB, CKMBINDEX, TROPONINI in the last 168 hours. BNP (last 3 results) No results for input(s): BNP in the last 8760 hours.  ProBNP (last 3 results) No results for input(s): PROBNP in the last 8760 hours.  CBG: No results for input(s): GLUCAP in the last 168 hours.  Recent Results (from the past 240 hour(s))  CSF culture w Gram Stain     Status: None   Collection Time: 11/12/20 12:20 PM   Specimen: CSF; Cerebrospinal Fluid  Result Value Ref Range Status   Specimen Description CSF  Final  Special Requests LP  Final   Gram Stain   Final    WBC PRESENT, PREDOMINANTLY MONONUCLEAR NO ORGANISMS SEEN CYTOSPIN SMEAR    Culture   Final    NO GROWTH 3 DAYS Performed at [facility], [place] [address] [place], [place] [address]    Report Status 11/15/2020 FINAL  Final     Studies: No results found.  Scheduled Meds:  enoxaparin (LOVENOX) injection  40 mg Subcutaneous Q24H   ibuprofen  400 mg Oral QID   levETIRAcetam  500 mg Oral BID   OLANZapine zydis  5 mg Oral Daily   And   OLANZapine zydis  10 mg Oral QHS   [START ON 11/19/2020] thiamine injection  100 mg Intravenous Daily   Continuous Infusions:  thiamine injection Stopped (11/17/20 0958)    Principal Problem:   Encephalopathy acute Active Problems:   Seizure disorder ([place])   Lung neoplasm   Other fracture of left great toe, initial encounter for closed fracture   Encephalopathy   Autoimmune encephalitis   Severe protein-calorie malnutrition  [facility])   Consultants: Psychiatry Pulmonary medicine Neurology Oncology  Procedures: Lumbar puncture performed 6/9  Antibiotics: None   Time spent: 5 minutes    Erin Hearing ANP  [facility] 7 am - 330 pm/M-F for direct patient care and secure chat Please refer to Amion for contact info 14  days

## 2020-11-19 LAB — MISC LABCORP TEST (SEND OUT): Labcorp test code: 9985

## 2020-11-19 LAB — CYTOLOGY - NON PAP

## 2020-11-19 MED ORDER — TOPIRAMATE 25 MG PO TABS
100.0000 mg | ORAL_TABLET | Freq: Two times a day (BID) | ORAL | Status: DC
  Administered 2020-11-19 – 2020-11-28 (×19): 100 mg via ORAL
  Filled 2020-11-19 (×20): qty 4

## 2020-11-19 MED ORDER — MAGNESIUM HYDROXIDE 400 MG/5ML PO SUSP
30.0000 mL | Freq: Once | ORAL | Status: AC
  Administered 2020-11-19: 30 mL via ORAL
  Filled 2020-11-19: qty 30

## 2020-11-19 MED ORDER — POLYETHYLENE GLYCOL 3350 17 G PO PACK
17.0000 g | PACK | Freq: Every day | ORAL | Status: DC
  Administered 2020-11-19 – 2020-11-23 (×5): 17 g via ORAL
  Filled 2020-11-19 (×7): qty 1

## 2020-11-19 MED ORDER — DOCUSATE SODIUM 100 MG PO CAPS
100.0000 mg | ORAL_CAPSULE | Freq: Two times a day (BID) | ORAL | Status: DC
  Administered 2020-11-19 – 2020-11-23 (×10): 100 mg via ORAL
  Filled 2020-11-19 (×11): qty 1

## 2020-11-19 MED ORDER — QUETIAPINE FUMARATE 100 MG PO TABS
100.0000 mg | ORAL_TABLET | Freq: Two times a day (BID) | ORAL | Status: DC | PRN
  Administered 2020-11-19 – 2020-11-23 (×8): 100 mg via ORAL
  Filled 2020-11-19 (×8): qty 1

## 2020-11-19 MED ORDER — ENSURE ENLIVE PO LIQD
237.0000 mL | Freq: Two times a day (BID) | ORAL | Status: DC
  Administered 2020-11-19 – 2020-11-24 (×11): 237 mL via ORAL

## 2020-11-19 MED ORDER — THIAMINE HCL 100 MG PO TABS
100.0000 mg | ORAL_TABLET | Freq: Every day | ORAL | Status: DC
  Administered 2020-11-20 – 2020-11-27 (×8): 100 mg via ORAL
  Filled 2020-11-19 (×8): qty 1

## 2020-11-19 MED ORDER — HYDROXYZINE HCL 25 MG PO TABS
25.0000 mg | ORAL_TABLET | ORAL | Status: DC | PRN
  Administered 2020-11-19: 25 mg via ORAL
  Filled 2020-11-19: qty 1

## 2020-11-19 MED ORDER — PHENOBARBITAL 32.4 MG PO TABS
64.8000 mg | ORAL_TABLET | Freq: Two times a day (BID) | ORAL | Status: DC
  Administered 2020-11-19 – 2020-11-24 (×11): 64.8 mg via ORAL
  Filled 2020-11-19 (×11): qty 2

## 2020-11-19 NOTE — Progress Notes (Signed)
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 I called to notify his wife. Oncology consult order placed, but needs to be called in tomorrow.  His wife wants to notify the medical team that she will be working 7-7 all weekend and will let the nurses know when she will have breaks when she will be available by phone.  [name], DO 11/19/20 5:23 PM [facility]

## 2020-11-19 NOTE — Progress Notes (Signed)
No BM >3days, [place], MD alerted.

## 2020-11-19 NOTE — Plan of Care (Signed)
Doing well post- bronchoscopy. He denies SOB or new complaints. He is requesting more to eat since he knows he has been losing weight. RD consult placed and discussed with his nurse to get him a snack this morning.  [name], DO 11/19/20 8:00 AM [facility]

## 2020-11-19 NOTE — Progress Notes (Addendum)
[facility] PROGRESS NOTE  Benjamin [name] POE:[id number] DOB: [birth year] DOA: 11/04/2020 PCP: Glenda Chroman, MD  Status: Remains inpatient appropriate because:Altered mental status, Ongoing diagnostic testing needed not appropriate for outpatient work up, Unsafe d/c plan, and IV treatments appropriate due to intensity of illness or inability to take PO  Dispo: The patient is from: Home              Anticipated d/c is to: SNF-wife reports that prior to admission his behaviors have been erratic and at times violent and she does not feel safe at this time with patient returning home in current state              Patient currently is not medically stable to d/c.   Difficult to place patient Yes   Level of care: Telemetry Medical  Code Status: Full Family Communication:  DVT prophylaxis: Lovenox COVID vaccination status: Unknown    HPI: 55 y.o. male with medical history significant for seizure disorder who presented to [facility] emergency room with altered mental status.  His wife reported that he had been acting very erratically.  He was complaining of pain in his left foot.  Reportedly he had an injury to his left foot at home in the last day or 2 and has had pain in the foot since then.  He was found to have fracture of his left first toe.  Placed in a postop shoe in the emergency room.  He had an erratic behavior and was very anxious and uncooperative and he was dosed with Haldol Ativan and Benadryl in the emergency room.  Chest x-ray revealed a lingular pneumonia versus a central lung mass.  CT of his chest was obtained which showed a mass in the left lingula and surrounding postobstructive pneumonitis.  The mass extended into the left hilum and he had left hilar adenopathy.  He also had a mass in the right upper lobe.  MRI was obtained to make sure there is no metastasis that would be causing his symptoms.  MRI did not reveal any intracranial lesions.  Due to lack of specialists at  [facility] for further work-up so patient was transferred to [place].  Subjective: Patient alert and sitting up in chair today.  Very restless and is confused.  He answers all orientation questions in a rote and as if he is reading them off of the list.  He did say he was at [facility].  Denies possible lung cancer diagnosis did not have the comprehension to understand symptoms that brought him to the hospital and potential linkage to cancer diagnosis versus other issues.  Perseverating on preadmission medications such as Topamax and phenobarbital.  Objective: Vitals:   11/18/20 1950 11/19/20 0500  BP: 119/78 121/65  Pulse: 66 70  Resp: 18 17  Temp: 97.8 F (36.6 C) 98.2 F (36.8 C)  SpO2: 100% 100%    Intake/Output Summary (Last 24 hours) at 11/19/2020 0730 Last data filed at 11/18/2020 1115 Gross per 24 hour  Intake 1000 ml  Output --  Net 1000 ml   Filed Weights   11/04/20 2343 11/12/20 1040  Weight: 51.5 kg 51.5 kg    Exam:  Constitutional: Alert, restless and mildly agitated demonstrating wandering behaviors. Respiratory: Lungs are clear, no increased work of breathing.  He is stable on room air Cardiovascular: Heart sounds are normal, no peripheral edema, pulses regular Abdomen: LBM 6/10, soft, normoactive bowel sounds and nontender Neurologic: CN 2-12 grossly intact. Sensation intact,  DTR normal. Strength 4/5 x all 4 extremities.  Ambulates without difficulty or gait disturbance Psychiatric: Alert and despite answering orientation questions with somewhat appropriate responses he is clearly confused.  Lacks insight.  Perseverates on preadmission medications.  Walked up to the nurses station and ask "what am I supposed to do".   Assessment/Plan: Acute problems: Acute versus subacute encephalopathy vs acute psychotic break - unclear etiology, POA Rule out paraneoplastic limbic encephalopathy versus autoimmune encephalitis -Psychosis symptoms resolved but patient  remains confused and impulsive -6/16 Dr. Cheral Marker with neurology states that patient's mental status has improved on antipsychotic medication and it is unlikely that his symptoms are related to autoimmune encephalopathy for no indication to administer IVIG -Continue thiamine 100 mg daily -Increase Seroquel and add as needed Vistaril for continued issues with behaviors.  Continue Zyprexa-6/14 EKG with normal QTC -Resume preadmission Topamax -Continue telemetry safety monitoring for sitter due to ongoing issues with impulsivity and wandering   Incidental lung nodule/opacification/mass, POA -Staging EBUS completed 6/15   Seizure disorder -No seizure since admission and EEGs negative -Continue Keppra and resume phenobarbital per neurology;  Constipation -No BM x3 days therefore will give milk of magnesia 30 cc x 1 -Begin MiraLAX as needed daily and Colace 100 mg twice daily scheduled   Other fracture of left great toe, initial encounter for closed fracture Ambulates without difficulty and denies pain  Severe protein calorie malnutrition 2/2 chronic illness, cancer as evidenced by significant weight loss, severe fat depletion and severe muscle depletion Body mass index is 17.78 kg/m.  Nutrition recommended the addition of Ensure Enlive twice daily     Data Reviewed: Basic Metabolic Panel: Recent Labs  Lab 11/14/20 0343 11/15/20 0811 11/16/20 0454 11/17/20 0049 11/18/20 0333  NA 140 140 140 138 141  K 4.1 3.9 4.0 3.9 4.1  CL 103 106 107 101 103  CO2 28 24 26 29 28   GLUCOSE 123* 127* 105* 109* 109*  BUN 20 16 22* 23* 37*  CREATININE 0.80 0.75 0.82 0.85 0.89  CALCIUM 9.5 9.6 9.3 9.1 9.6   Liver Function Tests: Recent Labs  Lab 11/13/20 0138 11/14/20 0343 11/15/20 0811 11/16/20 0454 11/17/20 0049  AST 20 17 15 17  14*  ALT 26 24 25 23 19   ALKPHOS 80 74 80 73 72  BILITOT 0.4 0.4 0.3 0.2* 0.3  PROT 6.9 6.8 7.0 6.3* 6.5  ALBUMIN 3.4* 3.2* 3.4* 3.1* 3.2*   No results  for input(s): LIPASE, AMYLASE in the last 168 hours. No results for input(s): AMMONIA in the last 168 hours. CBC: Recent Labs  Lab 11/14/20 0343 11/15/20 0811 11/16/20 0454 11/17/20 0049 11/18/20 0333  WBC 7.7 7.8 7.0 7.5 7.4  NEUTROABS  --   --   --  4.6  --   HGB 12.1* 11.9* 10.9* 10.7* 10.2*  HCT 36.4* 37.1* 34.4* 33.4* 31.9*  MCV 97.8 100.0 101.5* 100.9* 101.6*  PLT 288 315 309 300 312   Cardiac Enzymes: No results for input(s): CKTOTAL, CKMB, CKMBINDEX, TROPONINI in the last 168 hours. BNP (last 3 results) No results for input(s): BNP in the last 8760 hours.  ProBNP (last 3 results) No results for input(s): PROBNP in the last 8760 hours.  CBG: No results for input(s): GLUCAP in the last 168 hours.  Recent Results (from the past 240 hour(s))  CSF culture w Gram Stain     Status: None   Collection Time: 11/12/20 12:20 PM   Specimen: CSF; Cerebrospinal Fluid  Result Value Ref Range  Status   Specimen Description CSF  Final   Special Requests LP  Final   Gram Stain   Final    WBC PRESENT, PREDOMINANTLY MONONUCLEAR NO ORGANISMS SEEN CYTOSPIN SMEAR    Culture   Final    NO GROWTH 3 DAYS Performed at [facility], [address] [place], [place] [address]    Report Status 11/15/2020 FINAL  Final     Studies: No results found.  Scheduled Meds:  enoxaparin (LOVENOX) injection  40 mg Subcutaneous Q24H   ibuprofen  400 mg Oral QID   levETIRAcetam  500 mg Oral BID   OLANZapine zydis  5 mg Oral Daily   And   OLANZapine zydis  10 mg Oral QHS   thiamine injection  100 mg Intravenous Daily   Continuous Infusions:  lactated ringers Stopped (11/18/20 1115)   thiamine injection Stopped (11/18/20 1239)    Principal Problem:   Encephalopathy acute Active Problems:   Seizure disorder ([place])   Lung neoplasm   Other fracture of left great toe, initial encounter for closed fracture   Encephalopathy   Autoimmune encephalitis   Severe protein-calorie  malnutrition [facility])   Consultants: Psychiatry Pulmonary medicine Neurology Oncology  Procedures: Lumbar puncture performed 6/9  Antibiotics: None   Time spent: 5 minutes    Erin Hearing ANP  [facility] 7 am - 330 pm/M-F for direct patient care and secure chat Please refer to Amion for contact info 15  days

## 2020-11-19 NOTE — TOC Progression Note (Signed)
Transition of Care [facility]) - Progression Note    Patient Details  Name: Benjamin [name] MRN: [id number] Date of Birth: Apr 20, 1966  Transition of Care [facility]) CM/SW Contact  Curlene Labrum, RN Phone Number: 11/19/2020, 11:09 AM  Clinical Narrative:    Case management called and left a message with the patient's wife, Benjamin [name], on the phone to discuss transitions of care and possible need for SNF placement.  The patient's wife has expressed in previous provider notes that she is unable to care for the patient at home at this time and is requesting SNF placement once the patient is medically stable for discharge.  The patient is currently not stable for discharge at this time and medical work up continues.  CM will continue to follow the patient for transitions of care needs.   Expected Discharge Plan: [place] Barriers to Discharge: Continued Medical Work up  Expected Discharge Plan and Services Expected Discharge Plan: [place] In-house Referral: Clinical Social Work Discharge Planning Services: CM Consult Post Acute Care Choice: [place] Living arrangements for the past 2 months: Mobile Home                                       Social Determinants of Health (SDOH) Interventions    Readmission Risk Interventions No flowsheet data found.

## 2020-11-19 NOTE — Progress Notes (Signed)
Initial Nutrition Assessment  DOCUMENTATION CODES:  Severe malnutrition in context of chronic illness, Underweight  INTERVENTION:  Add double portions of meals.  Add Ensure Enlive po BID, each supplement provides 350 kcal and 20 grams of protein.  NUTRITION DIAGNOSIS:  Severe Malnutrition related to chronic illness, cancer and cancer related treatments as evidenced by percent weight loss, severe fat depletion, severe muscle depletion.  GOAL:  Patient will meet greater than or equal to 90% of their needs  MONITOR:  PO intake, Supplement acceptance, Labs, Weight trends, Skin, I & O's  REASON FOR ASSESSMENT:  Consult Assessment of nutrition requirement/status  ASSESSMENT:  55 yo male with a PMH of L lung mass and tobacco use who presents with mediastinal adenopthy. 6/15 - bronchoscopy with endobronchial ultrasound and biopsies  Spoke with pt at bedside. Pt complaining of hunger. Pt is interested in having double portions and extra chocolate milk. Given pt's recent weight loss, RD thinks he would benefit from double portions. Per Epic, pt has limited documentation of meals, but most meals are 100% consumption.  Pt with no recent weight history in Epic. Per Care Everywhere, pt weighed 122 lbs on 10/27/20. Most recent weight is 113 lbs from 11/12/20, which is a 7.3% decrease, which is significant and severe. Pt needs to be reweighed for updated weight.  On exam, pt is severely malnourished given severe depletions throughout.  Recommend double portions at meals and Ensure Enlive BID.  Medications: reviewed; colace BID, Keppra, Milk of Magnesia, miralax, thiamine, LR @ 20 ml/hr  Labs: reviewed; Glucose 109  NUTRITION - FOCUSED PHYSICAL EXAM: Flowsheet Row Most Recent Value  Orbital Region Severe depletion  Upper Arm Region Severe depletion  Thoracic and Lumbar Region Moderate depletion  Buccal Region Severe depletion  Temple Region Severe depletion  Clavicle Bone Region Severe  depletion  Clavicle and Acromion Bone Region Severe depletion  Scapular Bone Region Severe depletion  Dorsal Hand Severe depletion  Patellar Region Severe depletion  Anterior Thigh Region Severe depletion  Posterior Calf Region Severe depletion  Edema (RD Assessment) None  Hair Reviewed  Eyes Reviewed  Mouth Reviewed  Skin Reviewed  Nails Reviewed   Diet Order:   Diet Order             Diet regular Room service appropriate? Yes; Fluid consistency: Thin  Diet effective now                  EDUCATION NEEDS:  Education needs have been addressed  Skin:  Skin Assessment: Skin Integrity Issues: Skin Integrity Issues:: Other (Comment) Other: Skin tear, L arm and non-pressure wound, L toe  Last BM:  11/13/20 - OBR in place  Height:  Ht Readings from Last 1 Encounters:  11/12/20 5\' 7"  (1.702 m)   Weight:  Wt Readings from Last 1 Encounters:  11/12/20 51.5 kg   Ideal Body Weight:  67.3 kg  BMI:  Body mass index is 17.78 kg/m.  Estimated Nutritional Needs:  Kcal:  2300-2500 Protein:  85-100 grams Fluid:  >2 L  Derrel Nip, RD, LDN Registered Dietitian I After-Hours/Weekend Pager # in [place]

## 2020-11-19 NOTE — Progress Notes (Signed)
Neurology Progress Note  Interval history: Mr Balan was admitted 14 days ago for altered mental status. + history of seizures. CT chest showed a lung mass and he has been worked up for that with bronchoscopy with biopsies 11/18/20. 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 Concern for non compliance leading to seizures, Keppra was started.   On neurological consult early in stay, the patient had been hallucinating, showing signs of paranoia, and was disoriented. He was in restraints due to agitation. Psychiatry was consulted for psychosis and started patient on Seroquel on 11/12/20 with much improvement in his exam.   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   O: Current vital signs: BP 121/65 (BP Location: Left Arm)   Pulse 70   Temp 98.2 F (36.8 C) (Oral)   Resp 17   Ht 5\' 7"  (1.702 m)   Wt 51.5 kg   SpO2 100%   BMI 17.78 kg/m  Vital signs in last 24 hours: Temp:  [97.8 F (36.6 C)-98.3 F (36.8 C)] 98.2 F (36.8 C) (06/16 0500) Pulse Rate:  [66-90] 70 (06/16 0500) Resp:  [17-22] 17 (06/16 0500) BP: (96-129)/(56-78) 121/65 (06/16 0500) SpO2:  [93 %-100 %] 100 % (06/16 0500)  GENERAL: Awake, alert in NAD. Ambulating in room and hallway. Slender body habitus. HEENT: Normocephalic and atraumatic LUNGS: Normal respiratory effort.  Ext: warm Psych: flat affect. No signs of psychosis.    NEURO:  Mental Status: Alert and oriented to self, place, month, year. Disoriented to day, date.  Speech/Language: speech is without aphasia or dysarthria. Mildly bradyphrenic. Naming, repetition, fluency, and comprehension intact. He seems lonely; he tells me about his wife coming in 5 days and that he just needs someone to talk to.  Cranial Nerves:  II: PERRL. Visual fields full.  III, IV, VI: EOMI. Eyelids elevate symmetrically.  V: Sensation is intact to light touch and symmetrical to  face.  VII: Smile is symmetrical.  VIII: hearing intact to voice. IX, X: Palate elevates symmetrically. Phonation is normal.  YQ:[id number] shrug 5/5. XII: tongue is midline without fasciculations. Motor: 5/5 strength to all muscle groups tested.  Tone: is normal and bulk is normal Sensation- Intact to light touch bilaterally. Extinction absent when testing DSS.    Coordination: FTN intact bilaterally. No drift.  Gait- steady. No shuffling. Stance is normal. Arm swing normal.   Medications  Current Facility-Administered Medications:    acetaminophen (TYLENOL) tablet 650 mg, 650 mg, Oral, Q6H PRN, 650 mg at 11/17/20 1040 **OR** acetaminophen (TYLENOL) suppository 650 mg, 650 mg, Rectal, Q6H PRN, Chotiner, Yevonne Aline, MD   docusate sodium (COLACE) capsule 100 mg, 100 mg, Oral, BID, Erin Hearing L, NP, 100 mg at 11/19/20 0949   enoxaparin (LOVENOX) injection 40 mg, 40 mg, Subcutaneous, Q24H, Karren Cobble, RPH, 40 mg at 11/19/20 0809   feeding supplement (ENSURE ENLIVE / ENSURE PLUS) liquid 237 mL, 237 mL, Oral, BID BM, Danford, Suann Larry, MD   hydrOXYzine (ATARAX/VISTARIL) tablet 25 mg, 25 mg, Oral, Q4H PRN, Samella Parr, NP   ibuprofen (ADVIL) tablet 400 mg, 400 mg, Oral, QID, Samtani, Jai-Gurmukh, MD, 400 mg at 11/19/20 5638   lactated ringers infusion, , Intravenous, Continuous, [name], DO, Stopped at 11/18/20 1115   levETIRAcetam (KEPPRA) tablet 500 mg, 500 mg, Oral, BID, Samtani, Jai-Gurmukh, MD, 500 mg at 11/19/20 0808   OLANZapine zydis (ZYPREXA) disintegrating tablet 5 mg, 5 mg, Oral,  Daily, 5 mg at 11/19/20 0809 **AND** OLANZapine zydis (ZYPREXA) disintegrating tablet 10 mg, 10 mg, Oral, QHS, Ival Bible, MD, 10 mg at 11/18/20 2101   ondansetron (ZOFRAN) tablet 4 mg, 4 mg, Oral, Q6H PRN **OR** ondansetron (ZOFRAN) injection 4 mg, 4 mg, Intravenous, Q6H PRN, Chotiner, Yevonne Aline, MD, 4 mg at 11/17/20 1042   polyethylene glycol (MIRALAX / GLYCOLAX) packet  17 g, 17 g, Oral, Daily, Samella Parr, NP, 17 g at 11/19/20 0949   QUEtiapine (SEROQUEL) tablet 100 mg, 100 mg, Oral, BID PRN, Samella Parr, NP   [START ON 11/20/2020] thiamine tablet 100 mg, 100 mg, Oral, Daily, Pham, Minh Q, RPH-CPP   topiramate (TOPAMAX) tablet 100 mg, 100 mg, Oral, BID, Samella Parr, NP  Pertinent Labs CSF culture and gram stain NegTD. CSF with LP-RBC 36. Protein 97. WBC 1.   Assessment: 55 yo male who was originally admitted from OSH for a lung mass and psychotic type behavior. Neurology was consulted for opinion on paraneoplastic syndrome and limbic encephalopathy. Steroids and IVIG were considered, but patient has continued to improve on Seroquel, so we think that his presentation was psychiatric.  - Given his psychiatric improvement and overall improvement in exam, we do not feel that steroids or IVIG are needed at this time. - The elevated protein on his LP may be due to meningeal infiltrative process given his pulmonary mass, versus the paraneoplastic inflammation proposed by initial consulting neurologist. However, the latter is less likely as IgG index was normal. Will need to evaluate the former by obtaining repeat MRI brain with and without contrast to assess for possible abnormal meningeal enhancement  Recommendations: -Hold off on steroids or IVIG at this time.  -Vitamin B12 low for neurological standards. NP does not see where this is being supplemented, so will start.  -MRI brain with and without contrast.   Pt seen by Clance Boll, MSN, APN-BC/Nurse Practitioner/Neuro and later by MD. Note and plan to be edited as needed by MD.  Pager: [phone]  Electronically signed: Dr. Kerney Elbe

## 2020-11-20 ENCOUNTER — Encounter: Payer: Self-pay | Admitting: *Deleted

## 2020-11-20 ENCOUNTER — Encounter (HOSPITAL_COMMUNITY): Payer: Self-pay | Admitting: Critical Care Medicine

## 2020-11-20 MED ORDER — OLANZAPINE 10 MG IM SOLR
5.0000 mg | Freq: Once | INTRAMUSCULAR | Status: AC | PRN
  Administered 2020-11-20: 5 mg via INTRAMUSCULAR
  Filled 2020-11-20: qty 10

## 2020-11-20 MED ORDER — HYDROXYZINE HCL 25 MG PO TABS
25.0000 mg | ORAL_TABLET | Freq: Three times a day (TID) | ORAL | Status: DC
  Administered 2020-11-20 – 2020-11-22 (×9): 25 mg via ORAL
  Filled 2020-11-20 (×9): qty 1

## 2020-11-20 MED ORDER — MELATONIN 3 MG PO TABS
3.0000 mg | ORAL_TABLET | Freq: Every day | ORAL | Status: DC
  Administered 2020-11-20 – 2020-11-22 (×3): 3 mg via ORAL
  Filled 2020-11-20 (×3): qty 1

## 2020-11-20 MED ORDER — STERILE WATER FOR INJECTION IJ SOLN
INTRAMUSCULAR | Status: AC
  Filled 2020-11-20: qty 10

## 2020-11-20 NOTE — Progress Notes (Addendum)
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  The oncology new patient co-ordinator was notified by Norton Blizzard, RN and will notify the on call MD to see the patient. He remains stable, but confused  ( per the night nurse documentation ) on the floor post bronch 6/15.  He is asleep, on RA with sats of 98%, HR of 89. He has not slept in 3 nights, so I did not awaken him.  All additional care per Primary team. Please notify PCCM if we can be of any further service to this patient.   Magdalen Spatz, MSN, AGACNP-BC [place] for personal pager PCCM on call pager [phone]  11/20/2020 8:09 AM

## 2020-11-20 NOTE — Progress Notes (Signed)
I received a message from Eric Form NP that she has made referral on Mr. Baldwin.  I contacted new patient coordinator that patient is in the hospital and to notify on call doctor.

## 2020-11-20 NOTE — Progress Notes (Addendum)
Pt continues to remain confused. IV was no longer in place upon initial assessment of the night. When asked what happened to the iv pt began pointing. This nurse asked pt to use his words. Pt stated, "in the trash can." When asked why he removed it, pt stated, "I don't know." Pt educated on importance of not removing medical lines and that we would have to stick him again to place a new iv. Pt agreed. This RN left room to get iv start supplies. Upon arrival back to pt's room, pt standing at the trash can with the removed iv in his hand. Pt continues to wander from his room to the nursing station all night and hasn't slept in the past 3 nights or days (according to day nurses) despite being medicated per [facility]. This nurse has had continued care with the patient since Tuesday night (6/14). Pt requires very frequent monitoring and reorientation. Pt remains with active tele sitter orders in place. Will continue to monitor pt.  0200: Pt has now pulled out his 2nd iv tonight despite it being wrapped in guaze and taped and continues to leave his room to wander in the hall and nurses station more frequently. On call md paged through [place]. Will await call back.   0207: Dr. Myna Hidalgo returned call and updated on pt's status. New orders received. Will continue to monitor pt.

## 2020-11-20 NOTE — Progress Notes (Signed)
[facility] PROGRESS NOTE  Benjamin [name] IPJ:[id number] DOB: 26-Mar-1966 DOA: 11/04/2020 PCP: Glenda Chroman, MD  Status: Remains inpatient appropriate because:Altered mental status, Ongoing diagnostic testing needed not appropriate for outpatient work up, Unsafe d/c plan, and IV treatments appropriate due to intensity of illness or inability to take PO  Dispo: The patient is from: Home              Anticipated d/c is to: SNF-wife reports that prior to admission his behaviors have been erratic and at times violent and she does not feel safe at this time with patient returning home in current state              Patient currently is not medically stable to d/c.   Difficult to place patient Yes   Level of care: Telemetry Medical  Code Status: Full Family Communication:  DVT prophylaxis: Lovenox COVID vaccination status: Unknown    HPI: 55 y.o. male with medical history significant for seizure disorder who presented to [facility] emergency room with altered mental status.  His wife reported that he had been acting very erratically.  He was complaining of pain in his left foot.  Reportedly he had an injury to his left foot at home in the last day or 2 and has had pain in the foot since then.  He was found to have fracture of his left first toe.  Placed in a postop shoe in the emergency room.  He had an erratic behavior and was very anxious and uncooperative and he was dosed with Haldol Ativan and Benadryl in the emergency room.  Chest x-ray revealed a lingular pneumonia versus a central lung mass.  CT of his chest was obtained which showed a mass in the left lingula and surrounding postobstructive pneumonitis.  The mass extended into the left hilum and he had left hilar adenopathy.  He also had a mass in the right upper lobe.  MRI was obtained to make sure there is no metastasis that would be causing his symptoms.  MRI did not reveal any intracranial lesions.  Due to lack of specialists at  [facility] for further work-up so patient was transferred to [facility].  Subjective: Patient sitting on side of the bed.  Appears somewhat sedated.  Noted patient was exquisitely anxious overnight and had been pulling IVs and required as needed medications.  During conversation was more calm today and did not refute diagnosis of cancer when I explained this to him.  Room patient got up without assistance despite admonition from telemetry safety sitter.  He continued to stand in the doorway but had no specific requests.  Nursing assisting patient.  Objective: Vitals:   11/19/20 2105 11/20/20 0657  BP: 118/76 (!) 122/91  Pulse: 73 89  Resp: 16 18  Temp: 98.7 F (37.1 C) 97.7 F (36.5 C)  SpO2: 97% 98%    Intake/Output Summary (Last 24 hours) at 11/20/2020 0734 Last data filed at 11/19/2020 0750 Gross per 24 hour  Intake 580 ml  Output --  Net 580 ml   Filed Weights   11/04/20 2343 11/12/20 1040  Weight: 51.5 kg 51.5 kg    Exam:  Constitutional: Bland affect likely secondary to recent sedative medications and lack of sleep overnight, no acute physical distress Respiratory: Clear to auscultation, stable on room air without any increased work of breathing at rest or with activity Cardiovascular: S1-S2, no peripheral edema, regular nontachycardic pulse. Abdomen: LBM 6/10, abdomen soft nontender nondistended.  Patient  eating about 40 to 50% of meals today. Neurologic: CN 2-12 grossly intact. Sensation intact, DTR normal. Strength 4/5 x all 4 extremities.  Ambulates without difficulty or gait disturbance Psychiatric: Awake but appears sedated.  Oriented to name only.   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 -Neurology has ruled out  autoimmune encephalitis -Continue thiamine 100 mg daily -6/16 increased Seroquel; 6/17 change prn Vistaril to scheduled TID. Continue Zyprexa PO  -6/16 resumed preadmission Topamax -Continue safety sitter due to ongoing issues with impulsivity and wandering -As noted above continues to have issues with insomnia.  We will add melatonin scheduled at HS   Incidental lung nodule/small cell lung carcinoma -Staging EBUS completed 6/15 -6/16 pathology consistent with SCC, pulmonary medicine has consulted oncology and is in the process of contacting patient's wife  -Neurology has ordered without contrast today   Seizure disorder -No seizure since admission and EEGs negative -Continue Keppra and resume phenobarbital per neurology;  Constipation -6/16 no BM x3 days -given milk of magnesia 30 cc x 1-no BM documented -Begin MiraLAX as needed daily and Colace 100 mg twice daily scheduled   Other fracture of left great toe, initial encounter for closed fracture Ambulates without difficulty and denies pain  Severe protein calorie malnutrition 2/2 chronic illness, cancer as evidenced by significant weight loss, severe fat depletion and severe muscle depletion Body mass index is 17.78 kg/m.  Nutrition recommended the addition of Ensure Enlive twice daily     Data Reviewed: Basic Metabolic Panel: Recent Labs  Lab 11/14/20 0343 11/15/20 0811 11/16/20 0454 11/17/20 0049 11/18/20 0333  NA 140 140 140 138 141  K 4.1 3.9 4.0 3.9 4.1  CL 103 106 107 101 103  CO2 28 24 26 29 28   GLUCOSE 123* 127* 105* 109* 109*  BUN 20 16 22* 23* 37*  CREATININE 0.80 0.75 0.82 0.85 0.89  CALCIUM 9.5 9.6 9.3 9.1 9.6   Liver Function Tests: Recent Labs  Lab 11/14/20 0343 11/15/20 0811 11/16/20 0454 11/17/20 0049  AST 17 15 17  14*  ALT 24 25 23 19   ALKPHOS 74 80 73 72  BILITOT 0.4 0.3 0.2* 0.3  PROT 6.8 7.0 6.3* 6.5  ALBUMIN 3.2* 3.4* 3.1* 3.2*   No results for input(s): LIPASE, AMYLASE in the  last 168 hours. No results for input(s): AMMONIA in the last 168 hours. CBC: Recent Labs  Lab 11/14/20 0343 11/15/20 0811 11/16/20 0454 11/17/20 0049 11/18/20 0333  WBC 7.7 7.8 7.0 7.5 7.4  NEUTROABS  --   --   --  4.6  --   HGB 12.1* 11.9* 10.9* 10.7* 10.2*  HCT 36.4* 37.1* 34.4* 33.4* 31.9*  MCV 97.8 100.0 101.5* 100.9* 101.6*  PLT 288 315 309 300 312   Cardiac Enzymes: No results for input(s): CKTOTAL, CKMB, CKMBINDEX, TROPONINI in the last 168 hours. BNP (last 3 results) No results for input(s): BNP in the last 8760 hours.  ProBNP (last 3 results) No results for input(s): PROBNP in the last 8760 hours.  CBG: No results for input(s): GLUCAP in the last 168 hours.  Recent Results (from the past 240 hour(s))  CSF culture w Gram Stain     Status: None   Collection Time: 11/12/20 12:20 PM   Specimen: CSF; Cerebrospinal Fluid  Result Value Ref Range Status   Specimen Description CSF  Final   Special Requests LP  Final   Gram Stain   Final    WBC PRESENT, PREDOMINANTLY MONONUCLEAR NO ORGANISMS SEEN CYTOSPIN SMEAR    Culture   Final    NO GROWTH 3 DAYS Performed at [facility], [place] [address] [place], [place] [address]    Report Status 11/15/2020 FINAL  Final     Studies: No results found.  Scheduled Meds:  docusate sodium  100 mg Oral BID   enoxaparin (LOVENOX) injection  40 mg Subcutaneous Q24H   feeding supplement  237 mL Oral BID BM   ibuprofen  400 mg Oral QID   levETIRAcetam  500 mg Oral BID   OLANZapine zydis  5 mg Oral Daily   And   OLANZapine zydis  10 mg Oral QHS   phenobarbital  64.8 mg Oral BID   polyethylene glycol  17 g Oral Daily   thiamine  100 mg Oral Daily   topiramate  100 mg Oral BID   Continuous Infusions:  lactated ringers Stopped (11/18/20 1115)    Principal Problem:   Encephalopathy acute Active Problems:   Seizure disorder ([place])   Lung neoplasm   Other fracture of left great toe, initial encounter for closed  fracture   Encephalopathy   Autoimmune encephalitis   Severe protein-calorie malnutrition [facility])   Consultants: Psychiatry Pulmonary medicine Neurology Oncology  Procedures: Lumbar puncture performed 6/9  Antibiotics: None   Time spent: 25 minutes    Erin Hearing ANP  [facility] 7 am - 330 pm/M-F for direct patient care and secure chat Please refer to Amion for contact info 16  days

## 2020-11-20 NOTE — Progress Notes (Signed)
HEMATOLOGY-ONCOLOGY PROGRESS NOTE  SUBJECTIVE: Benjamin [name] remains confused.  He is resting quietly in bed.  Pathology results from bronchoscopy consistent with squamous cell carcinoma.  Neurology is following the patient due to concerns for paraneoplastic syndrome in limbic encephalopathy.  Review of the notes indicates that since the patient has continued to improve on Seroquel, they think presentation with psychiatric.  He was noted to have elevated protein on his LP which could be due to meningeal infiltrative process given his underlying pulmonary mass versus paraneoplastic inflammation, but the latter is thought to be less likely given IgG index was normal.  They recommended repeat MRI of the brain with and without contrast to assess for possible abnormal meningeal enhancement.  This has been ordered, but not yet performed.  REVIEW OF SYSTEMS:   Unable to obtain secondary to confusion.  I have reviewed the past medical history, past surgical history, social history and family history with the patient and they are unchanged from previous note.   PHYSICAL EXAMINATION: ECOG PERFORMANCE STATUS: 2 - Symptomatic, <50% confined to bed  Vitals:   11/19/20 2105 11/20/20 0657  BP: 118/76 (!) 122/91  Pulse: 73 89  Resp: 16 18  Temp: 98.7 F (37.1 C) 97.7 F (36.5 C)  SpO2: 97% 98%   Filed Weights   11/04/20 2343 11/12/20 1040  Weight: 51.5 kg 51.5 kg    Intake/Output from previous day: 06/16 0701 - 06/17 0700 In: 38 [P.O.:480] Out: -   GENERAL: Thin appearing male, no distress SKIN: skin color, texture, turgor are normal, no rashes or significant lesions EYES: normal, Conjunctiva are pink and non-injected, sclera clear OROPHARYNX:no exudate, no erythema and lips, buccal mucosa, and tongue normal  LUNGS: clear to auscultation and percussion with normal breathing effort HEART: regular rate & rhythm and no murmurs and no lower extremity edema ABDOMEN:abdomen soft, non-tender and normal  bowel sounds NEURO: Somewhat sedated, oriented to name only  LABORATORY DATA:  I have reviewed the data as listed CMP Latest Ref Rng & Units 11/18/2020 11/17/2020 11/16/2020  Glucose 70 - 99 mg/dL 109(H) 109(H) 105(H)  BUN 6 - 20 mg/dL 37(H) 23(H) 22(H)  Creatinine 0.61 - 1.24 mg/dL 0.89 0.85 0.82  Sodium 135 - 145 mmol/L 141 138 140  Potassium 3.5 - 5.1 mmol/L 4.1 3.9 4.0  Chloride 98 - 111 mmol/L 103 101 107  CO2 22 - 32 mmol/L 28 29 26   Calcium 8.9 - 10.3 mg/dL 9.6 9.1 9.3  Total Protein 6.5 - 8.1 g/dL - 6.5 6.3(L)  Total Bilirubin 0.3 - 1.2 mg/dL - 0.3 0.2(L)  Alkaline Phos 38 - 126 U/L - 72 73  AST 15 - 41 U/L - 14(L) 17  ALT 0 - 44 U/L - 19 23    Lab Results  Component Value Date   WBC 7.4 11/18/2020   HGB 10.2 (L) 11/18/2020   HCT 31.9 (L) 11/18/2020   MCV 101.6 (H) 11/18/2020   PLT 312 11/18/2020   NEUTROABS 4.6 11/17/2020    IR Fluoro Guide Ndl Plmt / BX  Result Date: 11/12/2020 CLINICAL DATA:  Benjamin [name] is a 55 y.o. male with PMH of headache, anxiety, TBI in 1986, and seizure disorder who presented to OSH due to AMS and is currently admitted to [facility] since 6/2/222. During admission at the OSH, a CT chest showed a mass in the left lingula and surrounding postobstructive pneumonitis. MR head was obtained to rule out metastasis, and it showed no acute intracranial process. Given questionable paraneoplastic limbic  encephalopathy, a lumbar puncture with general anesthesia was requested by the referring team. After thorough discussion and shared decision making, patient's wife decided to proceed with the lumbar puncture. EXAM: DIAGNOSTIC LUMBAR PUNCTURE UNDER FLUOROSCOPIC GUIDANCE COMPARISON:  None. FLUOROSCOPY TIME:  Fluoroscopy Time:  1.5 minute Radiation Exposure Index (if provided by the fluoroscopic device): 32mG y Number of Acquired Spot Images: 8 PROCEDURE: Informed consent was obtained from the patient's wide prior to the procedure, including potential complications of  headache, allergy, and pain. The procedure was performed under general anesthesia. With the patient prone, the lower back was prepped with Betadine. Lumbar puncture was performed at the L2-3 and L3-4 levels using a 16 gauge needle with return of clear CSF with an opening pressure of 11 cm water. Ten ml of CSF were obtained for laboratory studies. The patient tolerated the procedure well and there were no apparent complications. IMPRESSION: Successful fluoroscopy guided lumbar puncture. Electronically Signed   By: Pedro Earls M.D.   On: 11/12/2020 14:47   EEG adult  Result Date: 11/05/2020 Lora Havens, MD     11/05/2020 11:55 AM Patient Name: Benjamin [name] MRN: [id number] Epilepsy Attending: Lora Havens Referring Physician/Provider: Dr Harrold Donath Date: 11/05/2020 Duration: 22.32 mins Patient history: 55yo M with h/o seizure presented with AMS. EEG to evaluate for seizure Level of alertness: Awake, asleep AEDs during EEG study: LEV Technical aspects: This EEG study was done with scalp electrodes positioned according to the 10-20 International system of electrode placement. Electrical activity was acquired at a sampling rate of 500Hz  and reviewed with a high frequency filter of 70Hz  and a low frequency filter of 1Hz . EEG data were recorded continuously and digitally stored. Description: The posterior dominant rhythm consists of 8-9 Hz activity of moderate voltage (25-35 uV) seen predominantly in posterior head regions, symmetric and reactive to eye opening and eye closing. Sleep was characterized by vertex waves, sleep spindles (12 to 14 Hz), maximal frontocentral region.  EEG showed intermittent generalized 3 to 6 Hz theta-delta slowing. Hyperventilation and photic stimulation were not performed.   ABNORMALITY - Intermittent slow, generalized IMPRESSION: This study is suggestive of mild diffuse encephalopathy, nonspecific etiology. No seizures or epileptiform discharges were seen  throughout the recording. 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  He has ongoing confusion concerning for paraneoplastic syndrome with limbic encephalitis.  This is thought to be less likely at this time given continued improvement in his status with Seroquel.  Presentation is thought to be psychiatric.  However, he was noted to have elevated protein on his LP.  Neurology has ordered MRI to further evaluate.  Case reviewed with Dr. Lindi Adie who saw the patient in initial consultation on 11/09/2020.  If no definitive evidence of leptomeningeal disease, would recommend referral to cardiothoracic surgery to see if he is a candidate for resection.  I suspect that cardiothoracic surgery will want a PET scan before making a definitive recommendation.  PET scan will have to be done as an outpatient.  We would recommend discussing the case with cardiothoracic surgery once leptomeningeal disease has been ruled out.  Since Mr. Fadness lives in [place], [place], we can make arrangements for long-term medical oncology follow-up closer to home once he is discharged.   LOS: 16 days   Mikey Bussing, DNP, AGPCNP-BC, AOCNP  11/20/20  

## 2020-11-21 MED ORDER — IMMUNE GLOBULIN (HUMAN) 10 GM/100ML IV SOLN
400.0000 mg/kg | INTRAVENOUS | Status: DC
  Filled 2020-11-21: qty 200

## 2020-11-21 MED ORDER — IMMUNE GLOBULIN (HUMAN) 10 GM/100ML IV SOLN
400.0000 mg/kg | INTRAVENOUS | Status: AC
  Administered 2020-11-21 – 2020-11-25 (×5): 20 g via INTRAVENOUS
  Filled 2020-11-21 (×6): qty 200

## 2020-11-21 NOTE — Progress Notes (Signed)
Subjective: Patient sleeping on assessment this morning, per NA at bedside, patient did not sleep all night and paced around the unit Per bedside RN, patient has not slept well in approximately 6 days with constant confusion that waxes and wanes with 1 - 2 episodes of anger outbursts daily that resolve when he is left alone Patient uncooperative with examination this morning and states "leave me alone"   Objective: Current vital signs: BP (!) 91/55   Pulse 73   Temp 98.2 F (36.8 C)   Resp 16   Ht 5\' 7"  (1.702 m)   Wt 51.5 kg   SpO2 98%   BMI 17.78 kg/m  Vital signs in last 24 hours: Temp:  [98.2 F (36.8 C)-98.6 F (37 C)] 98.2 F (36.8 C) (06/18 0554) Pulse Rate:  [73-98] 73 (06/18 0554) Resp:  [16] 16 (06/17 2149) BP: (91-123)/(55-77) 91/55 (06/18 0554) SpO2:  [95 %-98 %] 98 % (06/18 0554)  Intake/Output from previous day: 06/17 0701 - 06/18 0700 In: 360 [P.O.:360] Out: -  Intake/Output this shift: Total I/O In: 240 [P.O.:240] Out: -  Nutritional status:  Diet Order             Diet regular Room service appropriate? Yes; Fluid consistency: Thin  Diet effective now                  Neurologic Exam: GENERAL: Laying comfortably in bed, in no acute distress.  HEENT: Normocephalic and atraumatic LUNGS: Normal respiratory effort, non-labored breathing Ext: warm, without edema Psych: flat affect. Does not participate in examination today. No signs of psychosis.    NEURO: Mental Status: Drowsy, responds verbally to voice but does not open eyes. Able to state his name and that he is in the hospital but when asked why he is in the hospital he states "because I am". He incorrectly states that he is 55 years old. When asked to name the month and year he mumbles unintelligibly and then clearly states "leave me alone" and does not participate further with examination.   Patient mumbles unintelligibly intermittently without obvious aphasia or dysarthria within limited  speech. Cranial Nerves: II: PERRL, 3 mm sluggishly reactive to light. Resists examiner eye opening, does not spontaneously open eyes or open eyes to command III, IV, VI: Eyes conjugate. Does not attempt to open eyes, fixate, or track examiner.  V: Patient does not provide information when asked about facial sensation during assessment.  VII: Face is symmetric resting and with movement. VIII: Hearing is intact to voice. IX, X: Phonation normal. Patient does not open mouth for examiner assessment when asked. XI: Patient does not shrug shoulders to command XII: Does not protrude tongue to command Motor: Grip strength 5/5 bilaterally with spontaneous antigravity movements of each extremity. Does not participate in further strength assessment. Tone: is normal and bulk is normal Sensation: Withdraws each extremity briskly to noxious stimuli and states "leave me alone" does not provide information regarding sensation to light touch   Coordination: Patient does not participate in coordination testing.  Gait: Deferred  Lab Results: No results found for this or any previous visit (from the past 48 hour(s)).  Recent Results (from the past 240 hour(s))  CSF culture w Gram Stain     Status: None   Collection Time: 11/12/20 12:20 PM   Specimen: CSF; Cerebrospinal Fluid  Result Value Ref Range Status   Specimen Description CSF  Final   Special Requests LP  Final   Gram Stain   Final  WBC PRESENT, PREDOMINANTLY MONONUCLEAR NO ORGANISMS SEEN CYTOSPIN SMEAR    Culture   Final    NO GROWTH 3 DAYS Performed at [facility], [place] [address] [place], [place] [address]    Report Status 11/15/2020 FINAL  Final    Lipid Panel No results for input(s): CHOL, TRIG, HDL, CHOLHDL, VLDL, LDLCALC in the last 72 hours.  Studies/Results: No results found.  Medications: Scheduled:  docusate sodium  100 mg Oral BID   enoxaparin (LOVENOX) injection  40 mg Subcutaneous Q24H   feeding supplement  237  mL Oral BID BM   hydrOXYzine  25 mg Oral TID   ibuprofen  400 mg Oral QID   levETIRAcetam  500 mg Oral BID   melatonin  3 mg Oral QHS   OLANZapine zydis  5 mg Oral Daily   And   OLANZapine zydis  10 mg Oral QHS   phenobarbital  64.8 mg Oral BID   polyethylene glycol  17 g Oral Daily   thiamine  100 mg Oral Daily   topiramate  100 mg Oral BID   Continuous:  lactated ringers Stopped (11/18/20 1115)    Assessment: 55 yo male who was originally admitted from OSH for a lung mass and psychotic type behavior. Neurology was consulted for opinion on paraneoplastic syndrome and limbic encephalopathy.  - Exam today reveals patient sleeping and not cooperative with examination asking to be left alone, mumbling unintelligibly, and with continued confusion. Discussion with inpatient RN reveals patient has not been sleeping well for approximately 6 days with continued waxing and waning of mental status and 1 to 2 bursts of anger daily.  - Given his psychiatric improvement and overall improvement in exam, it was felt yesterday that steroids or IVIG would not be needed at this time, however, given continued altered mentation without return to baseline and waxing / waning quality of mental status despite Seroquel, IVIG will be initiated for 5 days to empirically treat for possible paraneoplastic syndrome. Solumedrol was felt not to be appropriate with sleep disruption, recent agitation and psychosis, and remaining bursts of anger daily as Solumedrol may precipitate psychosis and worsen insomnia.  - The elevated protein on his LP may be due to meningeal infiltrative process given his pulmonary mass, versus the paraneoplastic inflammation proposed by initial consulting neurologist. However, the latter is less likely as IgG index was normal. Will need to evaluate these possibilities further by obtaining repeat MRI brain with and without contrast to assess for possible abnormal meningeal enhancement    Recommendations: -Continue ritamin B12 supplementation  -MRI brain with and without contrast is pending - Starting empiric IVIG today given persistent encephalopathy and waxing / waning mental status without return to baseline. Continue IVIG at 400 mg/kg/day x 5 days.    LOS: 17 days   @Electronically  signed: Dr. Kerney Elbe 11/21/2020  8:40 AM

## 2020-11-21 NOTE — Progress Notes (Signed)
PROGRESS NOTE    Benjamin [name]  YWV:[id number] DOB: 1965/08/01 DOA: 11/04/2020 PCP: Glenda Chroman, MD    Brief Narrative:  55 y.o. male with medical history significant for seizure disorder who presented to [facility] emergency room with altered mental status.  His wife reported that he had been acting very erratically.  He was complaining of pain in his left foot.  Reportedly he had an injury to his left foot at home in the last day or 2 and has had pain in the foot since then.  He was found to have fracture of his left first toe.  Placed in a postop shoe in the emergency room.  He had an erratic behavior and was very anxious and uncooperative and he was dosed with Haldol Ativan and Benadryl in the emergency room.  Chest x-ray revealed a lingular pneumonia versus a central lung mass.  CT of his chest was obtained which showed a mass in the left lingula and surrounding postobstructive pneumonitis.  The mass extended into the left hilum and he had left hilar adenopathy.  He also had a mass in the right upper lobe.  MRI was obtained to make sure there is no metastasis that would be causing his symptoms.  MRI did not reveal any intracranial lesions.  Due to lack of specialists at [facility] for further work-up so patient was transferred to [facility].  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 -Neurology has ruled out autoimmune encephalitis -Continue thiamine 100 mg daily -6/16 increased  Seroquel; 6/17 change prn Vistaril to scheduled TID. Continue Zyprexa PO  -6/16 resumed preadmission Topamax -Continue safety sitter due to ongoing issues with impulsivity and wandering -As noted above continues to have issues with insomnia.  Now continued on melatonin scheduled at HS   Incidental lung nodule/small cell lung carcinoma -Staging EBUS completed 6/15 -6/16 pathology consistent with SCC, pulmonary medicine has consulted oncology and is in the process of contacting patient's wife -Neurology has been following -Per Oncology, plan for PET scan as outpt   Seizure disorder -No seizure since admission and EEGs negative -Continue Keppra and resume phenobarbital per neurology;   Constipation -6/16 no BM x3 days -given milk of magnesia 30 cc x 1-no BM documented -Begin MiraLAX as needed daily and Colace 100 mg twice daily scheduled   Other fracture of left great toe, initial encounter for closed fracture Ambulates without difficulty and denies pain   Severe protein calorie malnutrition 2/2 chronic illness, cancer as evidenced by significant weight loss, severe fat depletion and severe muscle depletion Body mass index is 17.78 kg/m.  Nutrition recommended the addition of Ensure Enlive twice daily    DVT prophylaxis: Lovenox subq Code Status: Full Family Communication: Pt in room, family not at bedside  Status is: Inpatient  Remains inpatient appropriate because:Inpatient level of care appropriate due to severity of illness  Dispo: The patient is from: Home              Anticipated d/c is to: SNF  Patient currently is not medically stable to d/c.   Difficult to place patient No       Consultants:    Procedures:    Antimicrobials: Anti-infectives (From admission, onward)    None       Subjective: Without complaints  Objective: Vitals:   11/20/20 2149 11/21/20 0554 11/21/20 1232 11/21/20 1353  BP: 123/77 (!) 91/55 93/65 100/69  Pulse: 98  73 95   Resp: 16  17 16   Temp: 98.6 F (37 C) 98.2 F (36.8 C) 98.6 F (37 C) 98.4 F (36.9 C)  TempSrc: Oral   Oral  SpO2: 96% 98% 97% 99%  Weight:      Height:        Intake/Output Summary (Last 24 hours) at 11/21/2020 1649 Last data filed at 11/21/2020 3785 Gross per 24 hour  Intake 240 ml  Output --  Net 240 ml   Filed Weights   11/04/20 2343 11/12/20 1040  Weight: 51.5 kg 51.5 kg    Examination: General exam: Awake, laying in bed, in nad Respiratory system: Normal respiratory effort, no wheezing  Data Reviewed: I have personally reviewed following labs and imaging studies  CBC: Recent Labs  Lab 11/15/20 0811 11/16/20 0454 11/17/20 0049 11/18/20 0333  WBC 7.8 7.0 7.5 7.4  NEUTROABS  --   --  4.6  --   HGB 11.9* 10.9* 10.7* 10.2*  HCT 37.1* 34.4* 33.4* 31.9*  MCV 100.0 101.5* 100.9* 101.6*  PLT 315 309 300 885   Basic Metabolic Panel: Recent Labs  Lab 11/15/20 0811 11/16/20 0454 11/17/20 0049 11/18/20 0333  NA 140 140 138 141  K 3.9 4.0 3.9 4.1  CL 106 107 101 103  CO2 24 26 29 28   GLUCOSE 127* 105* 109* 109*  BUN 16 22* 23* 37*  CREATININE 0.75 0.82 0.85 0.89  CALCIUM 9.6 9.3 9.1 9.6   GFR: Estimated Creatinine Clearance: 66.7 mL/min (by C-G formula based on SCr of 0.89 mg/dL). Liver Function Tests: Recent Labs  Lab 11/15/20 0811 11/16/20 0454 11/17/20 0049  AST 15 17 14*  ALT 25 23 19   ALKPHOS 80 73 72  BILITOT 0.3 0.2* 0.3  PROT 7.0 6.3* 6.5  ALBUMIN 3.4* 3.1* 3.2*   No results for input(s): LIPASE, AMYLASE in the last 168 hours. No results for input(s): AMMONIA in the last 168 hours. Coagulation Profile: Recent Labs  Lab 11/18/20 0333  INR 1.0   Cardiac Enzymes: No results for input(s): CKTOTAL, CKMB, CKMBINDEX, TROPONINI in the last 168 hours. BNP (last 3 results) No results for input(s): PROBNP in the last 8760 hours. HbA1C: No results for input(s): HGBA1C in the last 72 hours. CBG: No results for input(s): GLUCAP in  the last 168 hours. Lipid Profile: No results for input(s): CHOL, HDL, LDLCALC, TRIG, CHOLHDL, LDLDIRECT in the last 72 hours. Thyroid Function Tests: No results for input(s): TSH, T4TOTAL, FREET4, T3FREE, THYROIDAB in the last 72 hours. Anemia Panel: No results for input(s): VITAMINB12, FOLATE, FERRITIN, TIBC, IRON, RETICCTPCT in the last 72 hours. Sepsis Labs: No results for input(s): PROCALCITON, LATICACIDVEN in the last 168 hours.  Recent Results (from the past 240 hour(s))  CSF culture w Gram Stain     Status: None   Collection Time: 11/12/20 12:20 PM   Specimen: CSF; Cerebrospinal Fluid  Result Value Ref Range Status   Specimen Description CSF  Final   Special Requests LP  Final   Gram Stain   Final    WBC PRESENT, PREDOMINANTLY  MONONUCLEAR NO ORGANISMS SEEN CYTOSPIN SMEAR    Culture   Final    NO GROWTH 3 DAYS Performed at [facility], [place] [address] [place], [place] [address]    Report Status 11/15/2020 FINAL  Final     Radiology Studies: No results found.  Scheduled Meds:  docusate sodium  100 mg Oral BID   enoxaparin (LOVENOX) injection  40 mg Subcutaneous Q24H   feeding supplement  237 mL Oral BID BM   hydrOXYzine  25 mg Oral TID   ibuprofen  400 mg Oral QID   levETIRAcetam  500 mg Oral BID   melatonin  3 mg Oral QHS   OLANZapine zydis  5 mg Oral Daily   And   OLANZapine zydis  10 mg Oral QHS   phenobarbital  64.8 mg Oral BID   polyethylene glycol  17 g Oral Daily   thiamine  100 mg Oral Daily   topiramate  100 mg Oral BID   Continuous Infusions:  Immune Globulin 10%     lactated ringers Stopped (11/18/20 1115)     LOS: 17 days   Marylu Lund, MD [facility] Pager On Amion  If 7PM-7AM, please contact night-coverage 11/21/2020, 4:49 PM

## 2020-11-22 ENCOUNTER — Inpatient Hospital Stay (HOSPITAL_COMMUNITY): Payer: Medicaid Other

## 2020-11-22 IMAGING — MR MR HEAD W/O CM
4 series · 48 of 48 positions shown · non-contrast
Comparison: MR head without contrast [DATE]. MR head without
and with contrast [DATE] at [HOSPITAL] [facility]

CLINICAL DATA: Neuro deficit, acute, stroke suspected. Seizure
disorder. Altered mental status.

The examination had to be discontinued prior to completion due to
patient refused imaging beyond the diffusion sequences.
EXAM:
MRI HEAD WITHOUT CONTRAST
TECHNIQUE: Multiplanar, multiecho pulse sequences of the brain and surrounding
structures were obtained without intravenous contrast.

[Series 2: DWI · axial · 3.0mm · 0.94mm/px · z∈[-73,+73]mm · 18 of 97 slices shown (1 of 2)]
[im 1/97]
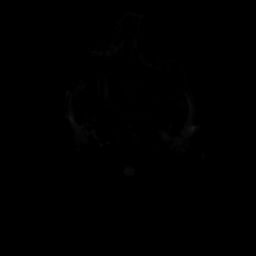
[im 6/97]
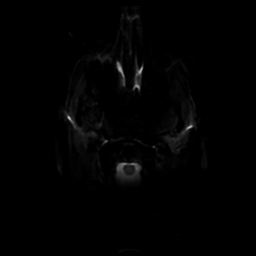
[im 12/97]
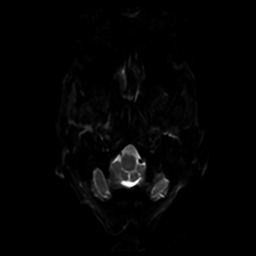
[im 17/97]
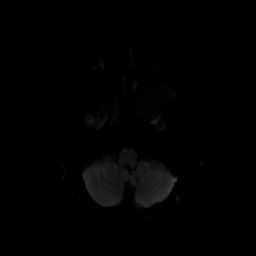
[im 23/97]
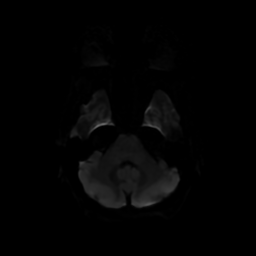
[im 29/97]
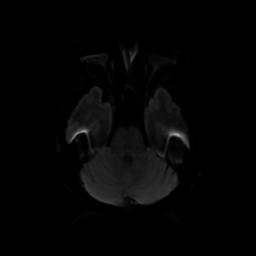
[im 34/97]
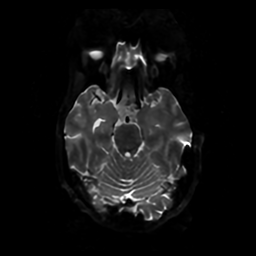
[im 40/97]
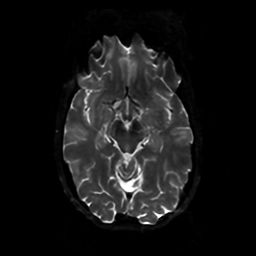
[im 46/97]
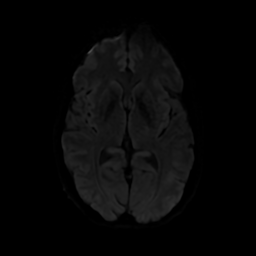
[im 51/97]
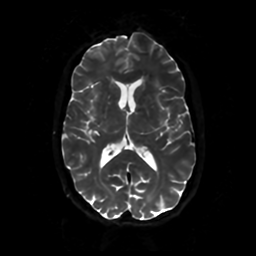
[im 57/97]
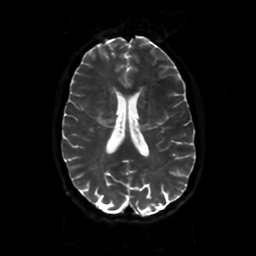
[im 63/97]
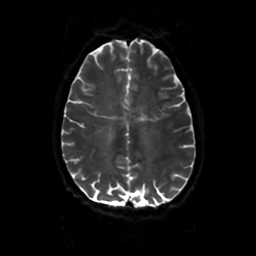
[im 68/97]
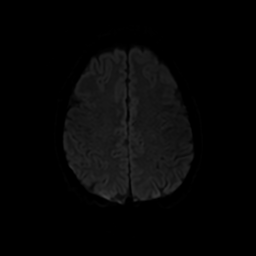
[im 74/97]
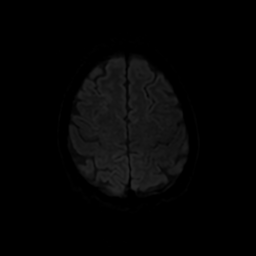
[im 80/97]
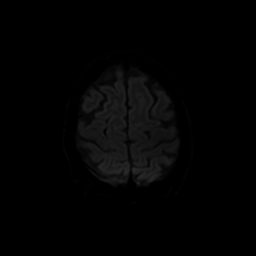
[im 85/97]
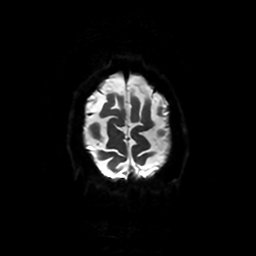
[im 91/97]
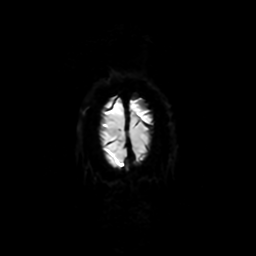
[im 97/97]
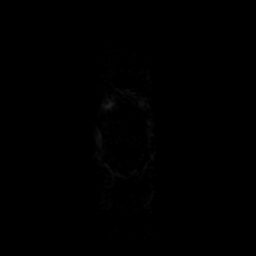

[Series 3: DWI · coronal · 4.0mm · 0.94mm/px · 14 of 74 slices shown (2 of 2)]
[im 1/74]
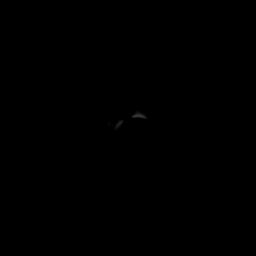
[im 6/74]
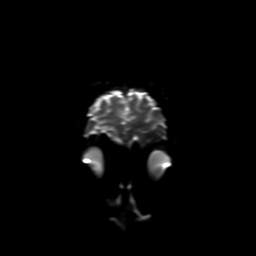
[im 12/74]
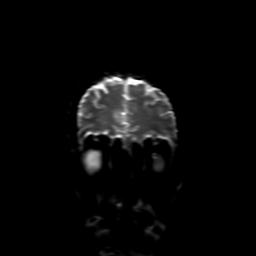
[im 17/74]
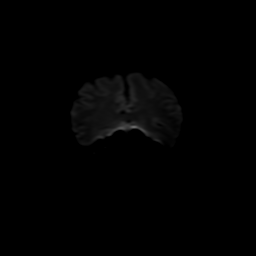
[im 23/74]
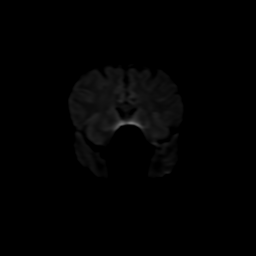
[im 29/74]
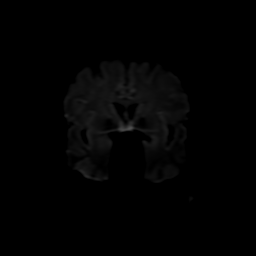
[im 34/74]
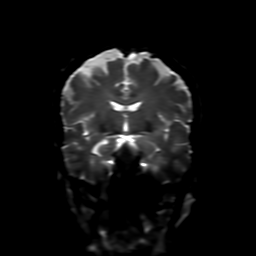
[im 40/74]
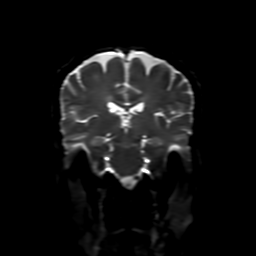
[im 45/74]
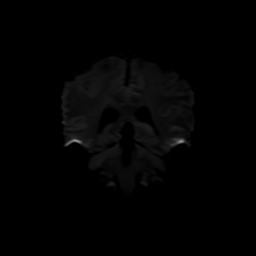
[im 51/74]
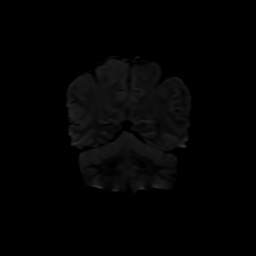
[im 57/74]
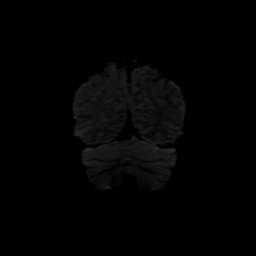
[im 62/74]
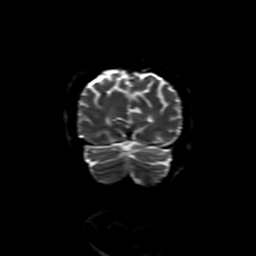
[im 68/74]
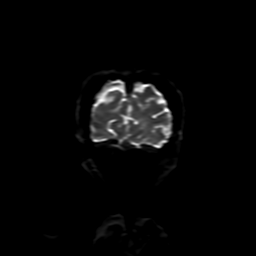
[im 74/74]
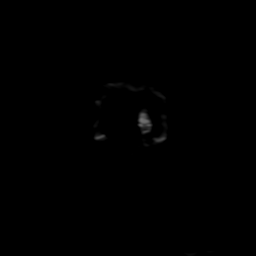

[Series 250: ADC · axial · 3.0mm · 0.94mm/px · z∈[-73,+73]mm · 9 of 50 slices shown (1 of 2)]
[im 1/50]
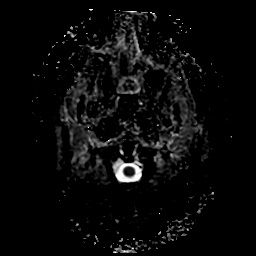
[im 7/50]
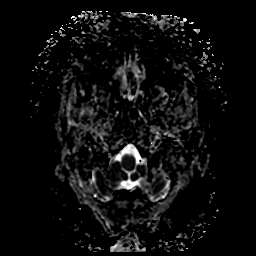
[im 13/50]
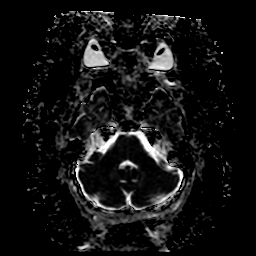
[im 19/50]
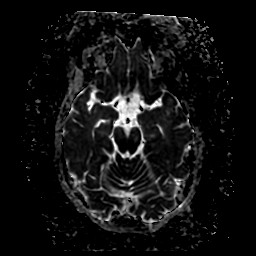
[im 25/50]
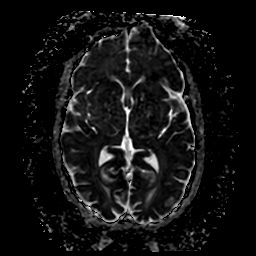
[im 31/50]
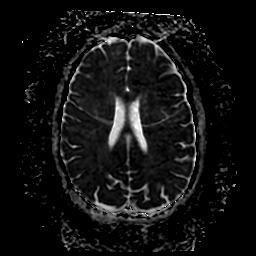
[im 37/50]
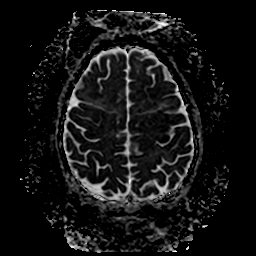
[im 43/50]
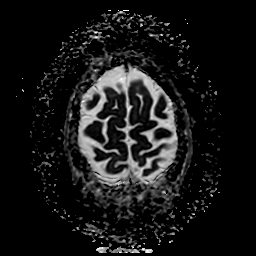
[im 50/50]
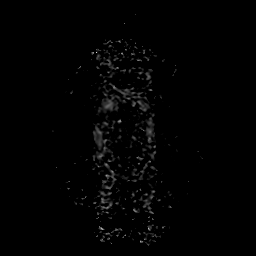

[Series 350: ADC · coronal · 4.0mm · 0.94mm/px · 7 of 36 slices shown (2 of 2)]
[im 1/36]
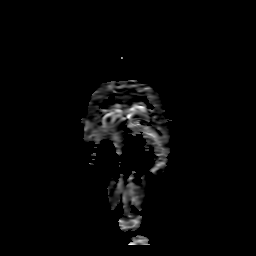
[im 6/36]
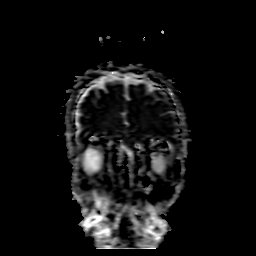
[im 12/36]
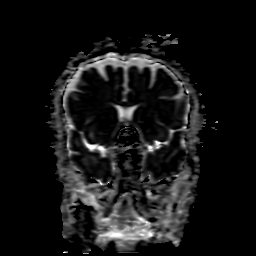
[im 18/36]
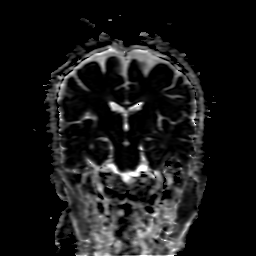
[im 24/36]
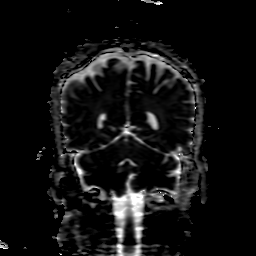
[im 30/36]
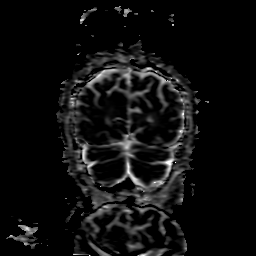
[im 36/36]
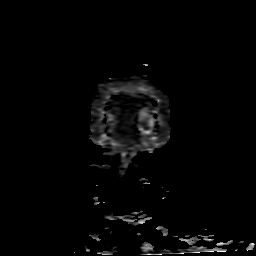

[48 of 48 positions shown; findings below may reference images not displayed]

FINDINGS: Brain: Diffusion weighted images demonstrate no acute or subacute
infarction. No restricted diffusion is present.
IMPRESSION: Patient refused imaging beyond diffusion-weighted sequences. No
acute or subacute infarction is present. No cortical restricted
diffusion to suggest recent seizure activity.

## 2020-11-22 MED ORDER — ALPRAZOLAM 0.5 MG PO TABS
0.5000 mg | ORAL_TABLET | ORAL | Status: AC | PRN
  Administered 2020-11-22: 0.5 mg via ORAL
  Filled 2020-11-22: qty 1

## 2020-11-22 NOTE — Progress Notes (Signed)
PROGRESS NOTE    Benjamin [name]  JHE:[id number] DOB: 1965-09-24 DOA: 11/04/2020 PCP: Glenda Chroman, MD    Brief Narrative:  55 y.o. male with medical history significant for seizure disorder who presented to [facility] emergency room with altered mental status.  His wife reported that he had been acting very erratically.  He was complaining of pain in his left foot.  Reportedly he had an injury to his left foot at home in the last day or 2 and has had pain in the foot since then.  He was found to have fracture of his left first toe.  Placed in a postop shoe in the emergency room.  He had an erratic behavior and was very anxious and uncooperative and he was dosed with Haldol Ativan and Benadryl in the emergency room.  Chest x-ray revealed a lingular pneumonia versus a central lung mass.  CT of his chest was obtained which showed a mass in the left lingula and surrounding postobstructive pneumonitis.  The mass extended into the left hilum and he had left hilar adenopathy.  He also had a mass in the right upper lobe.  MRI was obtained to make sure there is no metastasis that would be causing his symptoms.  MRI did not reveal any intracranial lesions.  Due to lack of specialists at [facility] for further work-up so patient was transferred to [facility].  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 -Neurology has ruled out autoimmune encephalitis -Continue thiamine 100 mg daily -6/16 increased  Seroquel; 6/17 change prn Vistaril to scheduled TID. Continue Zyprexa PO  -6/16 resumed preadmission Topamax -Continue safety sitter due to ongoing issues with impulsivity and wandering -As noted above continues to have issues with insomnia.  Now continued on melatonin scheduled at [facility] -per Neurology MRI brain with/without contrast is pending -Trial of IVIG x 5 days was started 6/18 per Neurology   Incidental lung nodule/small cell lung carcinoma -Staging EBUS completed 6/15 -6/16 pathology consistent with SCC, pulmonary medicine has consulted oncology and is in the process of contacting patient's wife -Neurology has been following -Per Oncology, plan for PET scan as outpt   Seizure disorder -No seizure since admission and EEGs negative -Continue Keppra and resume phenobarbital per neurology;   Constipation -6/16 no BM x3 days -given milk of magnesia 30 cc x 1-no BM documented -Begin MiraLAX as needed daily and Colace 100 mg twice daily scheduled   Other fracture of left great toe, initial encounter for closed fracture Ambulates without difficulty and denies pain   Severe protein calorie malnutrition 2/2 chronic illness, cancer as evidenced by significant weight loss, severe fat depletion and severe muscle depletion Body mass index is 17.78 kg/m.  Nutrition recommended the addition of Ensure Enlive twice daily    DVT prophylaxis: Lovenox subq Code Status: Full Family Communication: Pt in room, family not at bedside  Status is: Inpatient  Remains inpatient appropriate because:Inpatient level of care appropriate due to severity of illness  Dispo: The patient is from: Home  Anticipated d/c is to: SNF              Patient currently is not medically stable to d/c.   Difficult to place patient No    Consultants:    Procedures:    Antimicrobials: Anti-infectives (From admission, onward)    None       Subjective: Without complaints  Objective: Vitals:    11/22/20 0541 11/22/20 1210 11/22/20 1340 11/22/20 1409  BP: 108/81 97/76 101/73 100/68  Pulse: 79 82    Resp: 16 17 16 16   Temp: 98.2 F (36.8 C) 98.6 F (37 C) 98.4 F (36.9 C) 98.5 F (36.9 C)  TempSrc: Oral   Oral  SpO2: 98% 98% 98% 98%  Weight:      Height:        Intake/Output Summary (Last 24 hours) at 11/22/2020 1519 Last data filed at 11/22/2020 0945 Gross per 24 hour  Intake 360 ml  Output --  Net 360 ml    Filed Weights   11/04/20 2343 11/12/20 1040  Weight: 51.5 kg 51.5 kg    Examination: General exam: Laying in bed, in no acute distress Respiratory system: normal chest rise, clear, no audible wheezing  Data Reviewed: I have personally reviewed following labs and imaging studies  CBC: Recent Labs  Lab 11/16/20 0454 11/17/20 0049 11/18/20 0333  WBC 7.0 7.5 7.4  NEUTROABS  --  4.6  --   HGB 10.9* 10.7* 10.2*  HCT 34.4* 33.4* 31.9*  MCV 101.5* 100.9* 101.6*  PLT 309 300 643    Basic Metabolic Panel: Recent Labs  Lab 11/16/20 0454 11/17/20 0049 11/18/20 0333  NA 140 138 141  K 4.0 3.9 4.1  CL 107 101 103  CO2 26 29 28   GLUCOSE 105* 109* 109*  BUN 22* 23* 37*  CREATININE 0.82 0.85 0.89  CALCIUM 9.3 9.1 9.6    GFR: Estimated Creatinine Clearance: 66.7 mL/min (by C-G formula based on SCr of 0.89 mg/dL). Liver Function Tests: Recent Labs  Lab 11/16/20 0454 11/17/20 0049  AST 17 14*  ALT 23 19  ALKPHOS 73 72  BILITOT 0.2* 0.3  PROT 6.3* 6.5  ALBUMIN 3.1* 3.2*    No results for input(s): LIPASE, AMYLASE in the last 168 hours. No results for input(s): AMMONIA in the last 168 hours. Coagulation Profile: Recent Labs  Lab 11/18/20 0333  INR 1.0    Cardiac Enzymes: No results for input(s): CKTOTAL, CKMB, CKMBINDEX, TROPONINI in the last 168 hours. BNP (last 3 results) No results for input(s): PROBNP in the last 8760 hours. HbA1C: No results for input(s): HGBA1C in the last 72 hours. CBG: No results for input(s): GLUCAP in  the last 168 hours. Lipid Profile: No results for input(s): CHOL, HDL, LDLCALC, TRIG, CHOLHDL, LDLDIRECT in the last 72 hours. Thyroid Function Tests: No results for input(s): TSH, T4TOTAL, FREET4, T3FREE, THYROIDAB in the last 72 hours. Anemia Panel: No results for input(s): VITAMINB12, FOLATE, FERRITIN, TIBC, IRON, RETICCTPCT in the last 72 hours. Sepsis Labs: No results for input(s): PROCALCITON, LATICACIDVEN in the last 168 hours.  No results found for this or any previous visit (from the past 240 hour(s)).    Radiology Studies: No results found.  Scheduled Meds:  docusate sodium  100 mg Oral BID   enoxaparin (LOVENOX) injection  40 mg Subcutaneous Q24H   feeding supplement  237 mL Oral BID BM   hydrOXYzine  25 mg Oral TID   ibuprofen  400 mg Oral QID  levETIRAcetam  500 mg Oral BID   melatonin  3 mg Oral QHS   OLANZapine zydis  5 mg Oral Daily   And   OLANZapine zydis  10 mg Oral QHS   phenobarbital  64.8 mg Oral BID   polyethylene glycol  17 g Oral Daily   thiamine  100 mg Oral Daily   topiramate  100 mg Oral BID   Continuous Infusions:  Immune Globulin 10%     lactated ringers Stopped (11/18/20 1115)     LOS: 18 days   Marylu Lund, MD [facility] Pager On Amion  If 7PM-7AM, please contact night-coverage 11/22/2020, 3:19 PM

## 2020-11-23 LAB — COMPREHENSIVE METABOLIC PANEL
ALT: 16 U/L (ref 0–44)
AST: 18 U/L (ref 15–41)
Albumin: 3.2 g/dL — ABNORMAL LOW (ref 3.5–5.0)
Alkaline Phosphatase: 68 U/L (ref 38–126)
Anion gap: 8 (ref 5–15)
BUN: 48 mg/dL — ABNORMAL HIGH (ref 6–20)
CO2: 28 mmol/L (ref 22–32)
Calcium: 9.4 mg/dL (ref 8.9–10.3)
Chloride: 104 mmol/L (ref 98–111)
Creatinine, Ser: 1.1 mg/dL (ref 0.61–1.24)
GFR, Estimated: >60 mL/min (ref >60)
Glucose, Bld: 112 mg/dL — ABNORMAL HIGH (ref 70–99)
Potassium: 4.2 mmol/L (ref 3.5–5.1)
Sodium: 140 mmol/L (ref 135–145)
Total Bilirubin: 0.4 mg/dL (ref 0.3–1.2)
Total Protein: 7.6 g/dL (ref 6.5–8.1)

## 2020-11-23 LAB — CBC
HCT: 32.3 % — ABNORMAL LOW (ref 39.0–52.0)
Hemoglobin: 10.4 g/dL — ABNORMAL LOW (ref 13.0–17.0)
MCH: 32.5 pg (ref 26.0–34.0)
MCHC: 32.2 g/dL (ref 30.0–36.0)
MCV: 100.9 fL — ABNORMAL HIGH (ref 80.0–100.0)
Platelets: 279 10*3/uL (ref 150–400)
RBC: 3.2 MIL/uL — ABNORMAL LOW (ref 4.22–5.81)
RDW: 12.1 % (ref 11.5–15.5)
WBC: 4.4 10*3/uL (ref 4.0–10.5)
nRBC: 0 % (ref 0.0–0.2)

## 2020-11-23 MED ORDER — HYDROXYZINE HCL 25 MG PO TABS
25.0000 mg | ORAL_TABLET | ORAL | Status: DC
  Administered 2020-11-23 – 2020-11-25 (×5): 25 mg via ORAL
  Filled 2020-11-23 (×5): qty 1

## 2020-11-23 MED ORDER — STERILE WATER FOR INJECTION IJ SOLN
INTRAMUSCULAR | Status: AC
  Administered 2020-11-23: 10 mL
  Filled 2020-11-23: qty 10

## 2020-11-23 MED ORDER — ZIPRASIDONE HCL 20 MG PO CAPS
20.0000 mg | ORAL_CAPSULE | Freq: Two times a day (BID) | ORAL | Status: DC
  Administered 2020-11-23 – 2020-11-28 (×11): 20 mg via ORAL
  Filled 2020-11-23 (×13): qty 1

## 2020-11-23 MED ORDER — ZIPRASIDONE MESYLATE 20 MG IM SOLR
20.0000 mg | Freq: Four times a day (QID) | INTRAMUSCULAR | Status: DC | PRN
  Administered 2020-11-23 – 2020-11-24 (×2): 20 mg via INTRAMUSCULAR
  Filled 2020-11-23 (×3): qty 20

## 2020-11-23 MED ORDER — HYDROXYZINE HCL 25 MG PO TABS
50.0000 mg | ORAL_TABLET | Freq: Every day | ORAL | Status: DC
  Administered 2020-11-23 – 2020-11-27 (×5): 50 mg via ORAL
  Filled 2020-11-23 (×5): qty 2

## 2020-11-23 MED ORDER — ZOLPIDEM TARTRATE 5 MG PO TABS
10.0000 mg | ORAL_TABLET | Freq: Every day | ORAL | Status: DC
  Administered 2020-11-23 – 2020-11-24 (×2): 10 mg via ORAL
  Filled 2020-11-23 (×2): qty 2

## 2020-11-23 NOTE — Plan of Care (Signed)

## 2020-11-23 NOTE — Progress Notes (Signed)
[facility] PROGRESS NOTE  Benjamin [name] NIO:[id number] DOB: 1965/06/14 DOA: 11/04/2020 PCP: Glenda Chroman, MD  Status: Remains inpatient appropriate because:Altered mental status, Ongoing diagnostic testing needed not appropriate for outpatient work up, Unsafe d/c plan, and IV treatments appropriate due to intensity of illness or inability to take PO  Dispo: The patient is from: Home              Anticipated d/c is to: SNF-wife reports that prior to admission his behaviors have been erratic and at times violent and she does not feel safe at this time with patient returning home in current state              Patient currently is not medically stable to d/c.   Difficult to place patient Yes   Level of care: Telemetry Medical  Code Status: Full Family Communication:  DVT prophylaxis: Lovenox COVID vaccination status: Unknown    HPI: 55 y.o. male with medical history significant for seizure disorder who presented to [facility] emergency room with altered mental status.  His wife reported that he had been acting very erratically.  He was complaining of pain in his left foot.  Reportedly he had an injury to his left foot at home in the last day or 2 and has had pain in the foot since then.  He was found to have fracture of his left first toe.  Placed in a postop shoe in the emergency room.  He had an erratic behavior and was very anxious and uncooperative and he was dosed with Haldol Ativan and Benadryl in the emergency room.  Chest x-ray revealed a lingular pneumonia versus a central lung mass.  CT of his chest was obtained which showed a mass in the left lingula and surrounding postobstructive pneumonitis.  The mass extended into the left hilum and he had left hilar adenopathy.  He also had a mass in the right upper lobe.  MRI was obtained to make sure there is no metastasis that would be causing his symptoms.  MRI did not reveal any intracranial lesions.  Due to lack of specialists at  [facility] for further work-up so patient was transferred to [facility].  Subjective: Remains confused with current wandering behaviors.  Patient was standing in doorway of his room when I initially interviewed him and examined him.  I left to go examine and interview another patient down the hall and he followed me to that room and stood outside the room while I worked with the other patient.  Objective: Vitals:   11/22/20 1409 11/22/20 2100  BP: 100/68 135/88  Pulse:  81  Resp: 16 18  Temp: 98.5 F (36.9 C) 98.9 F (37.2 C)  SpO2: 98% 98%    Intake/Output Summary (Last 24 hours) at 11/23/2020 0741 Last data filed at 11/22/2020 0945 Gross per 24 hour  Intake 360 ml  Output --  Net 360 ml   Filed Weights   11/04/20 2343 11/12/20 1040  Weight: 51.5 kg 51.5 kg    Exam:  Constitutional: No acute physical distress, continues with flat affect Respiratory: Lungs remain clear and he is stable on room air. Cardiovascular: Normal heart sounds, no JVD or peripheral edema, regular pulse. Abdomen: LBM 6/18, abdomen soft nontender nondistended with normoactive bowel sounds.  Apparently has been incontinent of both bowel and bladder-eating well Neurologic: CN 2-12 grossly intact. Sensation intact, DTR normal. Strength 4/5 x all 4 extremities.  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 -Neurology has ruled out autoimmune encephalitis but given persistent sx's neuro is concerned re paraneoplastic encephalopathy so IVIG initiated x 5 days. MRI pending and likely not completed 2/2 behaviors. -Continue thiamine 100 mg daily -6/19 changed Vistaril to scheduled at 8 and 4. Change HS  Vistaril to 50 mg.  -6/20 discontinue Zyprexa and as needed Seroquel in favor of twice daily Geodon with as needed IM Geodon for aggressive behavior -Continue Topamax -Continue safety sitter due to ongoing issues with impulsivity and wandering -As noted above continues to have issues with insomnia and inappropriate behaviors  DC melatonin and add scheduled Ambien at HS   Incidental lung nodule/small cell lung carcinoma -Staging EBUS completed 6/15 -6/16 pathology consistent with SCC -MRI of brain pending-patient unable to cooperate even after being given benzodiazepine for mild sedation.  May require anesthesia to complete imaging.   Seizure disorder -No seizure since admission and EEGs negative -Continue Keppra and resume phenobarbital per neurology;  Constipation -Continue MiraLAX as needed daily and Colace 100 mg twice daily scheduled   Other fracture of left great toe, initial encounter for closed fracture Ambulates without difficulty and denies pain  Severe protein calorie malnutrition 2/2 chronic illness, cancer as evidenced by significant weight loss, severe fat depletion and severe muscle depletion Body mass index is 17.78 kg/m.  Nutrition recommended the addition of Ensure Enlive twice daily     Data Reviewed: Basic Metabolic Panel: Recent Labs  Lab 11/17/20 0049 11/18/20 0333 11/23/20 0428  NA 138 141 140  K 3.9 4.1 4.2  CL 101 103 104  CO2 29 28 28   GLUCOSE 109* 109* 112*  BUN 23* 37* 48*  CREATININE 0.85 0.89 1.10  CALCIUM 9.1 9.6 9.4   Liver Function Tests: Recent Labs  Lab 11/17/20 0049 11/23/20 0428  AST 14* 18  ALT 19 16  ALKPHOS 72 68  BILITOT 0.3 0.4  PROT 6.5 7.6  ALBUMIN 3.2* 3.2*   No results for input(s): LIPASE, AMYLASE in the last 168 hours. No results for input(s): AMMONIA in the last 168 hours. CBC: Recent Labs  Lab 11/17/20 0049 11/18/20 0333 11/23/20 0428  WBC 7.5 7.4 4.4  NEUTROABS 4.6  --   --   HGB 10.7* 10.2* 10.4*   HCT 33.4* 31.9* 32.3*  MCV 100.9* 101.6* 100.9*  PLT 300 312 279   Cardiac Enzymes: No results for input(s): CKTOTAL, CKMB, CKMBINDEX, TROPONINI in the last 168 hours. BNP (last 3 results) No results for input(s): BNP in the last 8760 hours.  ProBNP (last 3 results) No results for input(s): PROBNP in the last 8760 hours.  CBG: No results for input(s): GLUCAP in the last 168 hours.  No results found for this or any previous visit (from the past 240 hour(s)).    Studies: MR BRAIN WO CONTRAST  Result Date: 11/22/2020 CLINICAL DATA:  Neuro deficit, acute, stroke suspected. Seizure disorder. Altered mental status. The examination had to be discontinued prior to completion due to patient refused imaging beyond the diffusion sequences. EXAM: MRI HEAD WITHOUT CONTRAST TECHNIQUE: Multiplanar, multiecho pulse sequences of the brain and surrounding structures were obtained without intravenous contrast. COMPARISON:  MR head without contrast 07/10/2017. MR head without and with contrast 11/04/2020 at [place]: Brain: Diffusion weighted images demonstrate no acute or subacute infarction.  No restricted diffusion is present. IMPRESSION: Patient refused imaging beyond diffusion-weighted sequences. No acute or subacute infarction is present. No cortical restricted diffusion to suggest recent seizure activity. Electronically Signed   By: San Morelle M.D.   On: 11/22/2020 17:01    Scheduled Meds:  docusate sodium  100 mg Oral BID   enoxaparin (LOVENOX) injection  40 mg Subcutaneous Q24H   feeding supplement  237 mL Oral BID BM   hydrOXYzine  25 mg Oral 2 times per day   hydrOXYzine  50 mg Oral QHS   ibuprofen  400 mg Oral QID   levETIRAcetam  500 mg Oral BID   OLANZapine zydis  5 mg Oral Daily   And   OLANZapine zydis  10 mg Oral QHS   phenobarbital  64.8 mg Oral BID   polyethylene glycol  17 g Oral Daily   thiamine  100 mg Oral Daily   topiramate  100 mg Oral BID    zolpidem  10 mg Oral QHS   Continuous Infusions:  Immune Globulin 10%     lactated ringers Stopped (11/18/20 1115)    Principal Problem:   Encephalopathy acute Active Problems:   Seizure disorder ([place])   Lung neoplasm   Other fracture of left great toe, initial encounter for closed fracture   Encephalopathy   Autoimmune encephalitis   Severe protein-calorie malnutrition [facility])   Consultants: Psychiatry Pulmonary medicine Neurology Oncology  Procedures: Lumbar puncture performed 6/9  Antibiotics: None   Time spent: 25 minutes    Erin Hearing ANP  [facility] 7 am - 330 pm/M-F for direct patient care and secure chat Please refer to Amion for contact info 19  days

## 2020-11-24 ENCOUNTER — Inpatient Hospital Stay (HOSPITAL_COMMUNITY): Payer: Medicaid Other

## 2020-11-24 ENCOUNTER — Other Ambulatory Visit: Payer: Self-pay

## 2020-11-24 ENCOUNTER — Encounter: Payer: Self-pay | Admitting: *Deleted

## 2020-11-24 DIAGNOSIS — D491 Neoplasm of unspecified behavior of respiratory system: Secondary | ICD-10-CM

## 2020-11-24 IMAGING — DX DG ABD PORTABLE 1V
1 series · 1 of 1 positions shown · non-contrast
Comparison: No recent prior.

CLINICAL DATA: Constipation.

EXAM:
PORTABLE ABDOMEN - 1 VIEW

[abdomen]
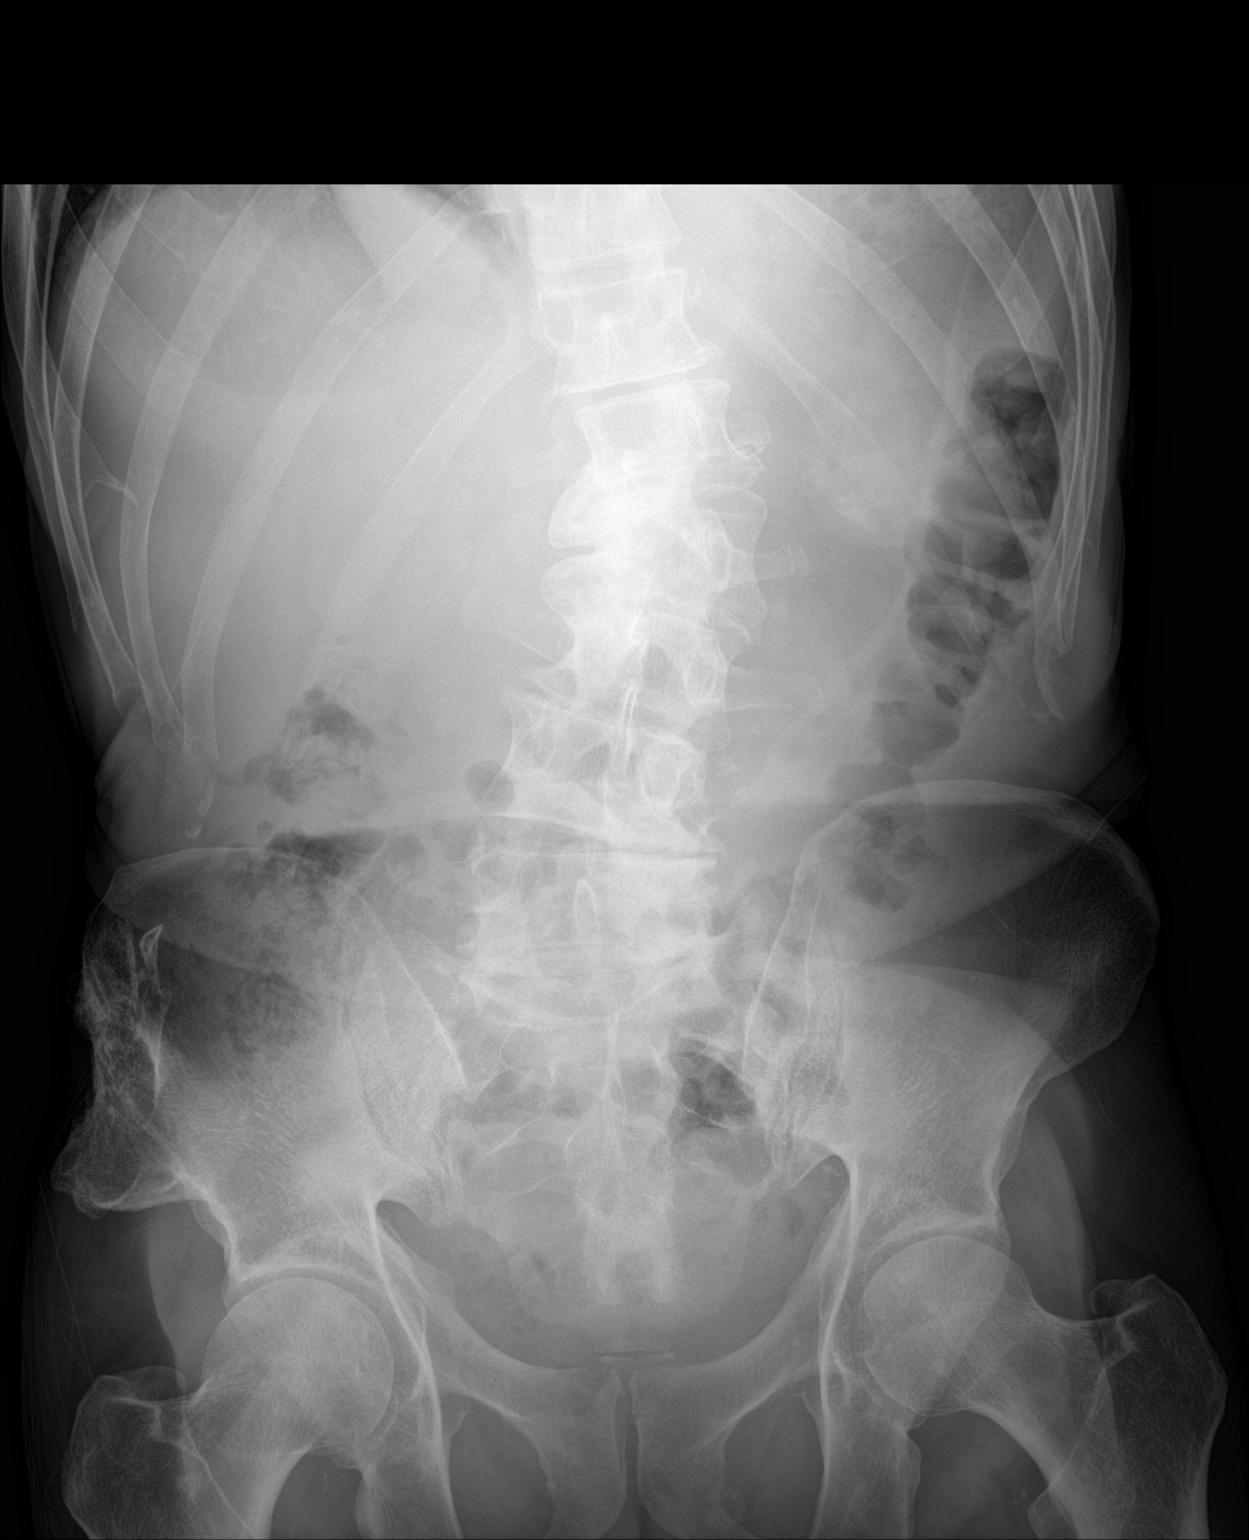

[1 of 1 positions shown; findings below may reference images not displayed]

FINDINGS: Soft tissue structures are unremarkable. Moderate stool volume. Mild
gastric distention cannot be excluded. No small bowel or colonic
distention. No free air. Prominent thoracolumbar spine scoliosis and
degenerative change. Degenerative changes both hips.
IMPRESSION: 1. Moderate stool volume. No small-bowel colonic distention. No free
air. 2. Mild gastric distention cannot be excluded.

## 2020-11-24 MED ORDER — ONDANSETRON HCL 4 MG/2ML IJ SOLN
4.0000 mg | Freq: Four times a day (QID) | INTRAMUSCULAR | Status: DC
  Administered 2020-11-24 – 2020-11-27 (×12): 4 mg via INTRAVENOUS
  Filled 2020-11-24 (×13): qty 2

## 2020-11-24 MED ORDER — MEMANTINE HCL 5 MG PO TABS
5.0000 mg | ORAL_TABLET | Freq: Two times a day (BID) | ORAL | Status: DC
  Administered 2020-11-24 – 2020-11-27 (×7): 5 mg via ORAL
  Filled 2020-11-24 (×7): qty 1

## 2020-11-24 MED ORDER — STERILE WATER FOR INJECTION IJ SOLN
INTRAMUSCULAR | Status: AC
  Administered 2020-11-24: 10 mL
  Filled 2020-11-24: qty 10

## 2020-11-24 MED ORDER — MIRTAZAPINE 15 MG PO TABS
15.0000 mg | ORAL_TABLET | Freq: Every day | ORAL | Status: DC
  Administered 2020-11-24 – 2020-11-27 (×4): 15 mg via ORAL
  Filled 2020-11-24 (×4): qty 1

## 2020-11-24 MED ORDER — ALPRAZOLAM 0.5 MG PO TABS
1.5000 mg | ORAL_TABLET | Freq: Every day | ORAL | Status: DC
  Administered 2020-11-24 – 2020-11-27 (×4): 1.5 mg via ORAL
  Filled 2020-11-24 (×4): qty 3

## 2020-11-24 MED ORDER — APIXABAN 2.5 MG PO TABS
2.5000 mg | ORAL_TABLET | Freq: Two times a day (BID) | ORAL | Status: DC
  Administered 2020-11-24 – 2020-11-27 (×7): 2.5 mg via ORAL
  Filled 2020-11-24 (×7): qty 1

## 2020-11-24 MED ORDER — PHENOBARBITAL 32.4 MG PO TABS
129.6000 mg | ORAL_TABLET | Freq: Two times a day (BID) | ORAL | Status: DC
  Administered 2020-11-24 – 2020-11-28 (×8): 129.6 mg via ORAL
  Filled 2020-11-24 (×8): qty 4

## 2020-11-24 NOTE — Progress Notes (Addendum)
1:30pm: CSW completed PASSR screening - documentation uploaded for further review.  11am: CSW and RN CM spoke with patient and his wife [name] at bedside to discuss discharge plan. [name] reports she is unable to provide patient with 24/7 supervision at home due to her job. [name] is requesting long term placement for patient and is agreeable to forfeit his income to the facility. [name] agreeable to placement anywhere in [place].  TOC will attempt to locate long term placement for patient once a treatment plan is determined by Oncology team.   Madilyn Fireman, MSW, LCSW Transitions of Care  Clinical Social Worker II [phone]

## 2020-11-24 NOTE — Progress Notes (Signed)
Pt believes that his BM's are blood and that he is "bleeding from his stomach" This nurse observed 2 BM's no blood was present.

## 2020-11-24 NOTE — NC FL2 (Signed)
Benjamin [name] LEVEL OF CARE SCREENING TOOL     IDENTIFICATION  Patient Name: Benjamin [name] Birthdate: November 30, 1965 Sex: male Admission Date (Current Location): 11/04/2020  [place] and [place] Number:  Benjamin [name] [id number] [place] and Address:  [facility], [place] [address] [place], [place] [address]      Provider Number: [id number]  Attending Physician Name and Address:  Benjamin Hazel, MD  Relative Name and Phone Number:  Benjamin [name], [phone]    Current Level of Care: Hospital Recommended Level of Care: [place] Prior Approval Number:    Date Approved/Denied:   PASRR Number:    Discharge Plan: SNF    Current Diagnoses: Patient Active Problem List   Diagnosis Date Noted   Severe protein-calorie malnutrition ([place]) 11/17/2020   Autoimmune encephalitis    Encephalopathy acute 11/05/2020   Lung neoplasm 11/05/2020   Other fracture of left great toe, initial encounter for closed fracture 11/05/2020   Encephalopathy 11/05/2020   Seizure disorder ([place]) 06/01/2017   Headache 06/01/2017    Orientation RESPIRATION BLADDER Height & Weight     Self, Place  Normal Continent Weight: 113 lb 8.6 oz (51.5 kg) Height:  5\' 7"  (170.2 cm)  BEHAVIORAL SYMPTOMS/MOOD NEUROLOGICAL BOWEL NUTRITION STATUS  Wanderer   Incontinent Diet  AMBULATORY STATUS COMMUNICATION OF NEEDS Skin   Independent Verbally Normal                       Personal Care Assistance Level of Assistance  Bathing, Feeding, Dressing Bathing Assistance: Limited assistance Feeding assistance: Independent Dressing Assistance: Limited assistance     Functional Limitations Info  Sight, Speech, Hearing Sight Info: Adequate Hearing Info: Adequate Speech Info: Adequate    SPECIAL CARE FACTORS FREQUENCY                       Contractures Contractures Info: Not present    Additional Factors Info  Allergies, Code Status, Psychotropic Code Status  Info: Full Allergies Info: Peanuts Psychotropic Info: Xanax, Ambien, Remeron, Atarax, Geodon         Current Medications (11/24/2020):  This is the current hospital active medication list Current Facility-Administered Medications  Medication Dose Route Frequency Provider Last Rate Last Admin   acetaminophen (TYLENOL) tablet 650 mg  650 mg Oral Q6H PRN Benjamin [name], Benjamin Aline, MD   650 mg at 11/21/20 0913   Or   acetaminophen (TYLENOL) suppository 650 mg  650 mg Rectal Q6H PRN Benjamin [name], Benjamin Aline, MD       ALPRAZolam Benjamin [name]) tablet 1.5 mg  1.5 mg Oral QHS Benjamin Parr, NP       apixaban Benjamin [name]) tablet 2.5 mg  2.5 mg Oral BID Benjamin Parr, NP   2.5 mg at 11/24/20 1158   docusate sodium (COLACE) capsule 100 mg  100 mg Oral BID Benjamin Parr, NP   100 mg at 11/23/20 2011   feeding supplement (ENSURE ENLIVE / ENSURE PLUS) liquid 237 mL  237 mL Oral BID BM Benjamin Dada, MD   237 mL at 11/24/20 0956   hydrOXYzine (ATARAX/VISTARIL) tablet 25 mg  25 mg Oral 2 times per day Benjamin Parr, NP   25 mg at 11/24/20 0959   hydrOXYzine (ATARAX/VISTARIL) tablet 50 mg  50 mg Oral QHS Benjamin Parr, NP   50 mg at 11/23/20 2011   ibuprofen (ADVIL) tablet 400 mg  400 mg Oral QID Benjamin Sells, MD  400 mg at 11/24/20 0947   Immune Globulin 10% (PRIVIGEN) IV infusion 20 g  400 mg/kg Intravenous Q24 Hr x 5 Benjamin [name], Benjamin [name]   Stopped at 11/23/20 1900   lactated ringers infusion   Intravenous Continuous Benjamin Hy, DO   Stopped at 11/18/20 1115   levETIRAcetam (KEPPRA) tablet 500 mg  500 mg Oral BID Benjamin Sells, MD   500 mg at 11/24/20 0948   memantine (NAMENDA) tablet 5 mg  5 mg Oral BID Benjamin Parr, NP   5 mg at 11/24/20 1158   mirtazapine (REMERON) tablet 15 mg  15 mg Oral QHS Benjamin Parr, NP       ondansetron [facility]) injection 4 mg  4 mg Intravenous Q6H Benjamin Parr, NP       PHENobarbital (LUMINAL) tablet 129.6 mg  129.6 mg Oral BID Benjamin Parr, NP       polyethylene glycol (MIRALAX / GLYCOLAX) packet 17 g  17 g Oral Daily Benjamin Parr, NP   17 g at 11/23/20 0842   thiamine tablet 100 mg  100 mg Oral Daily Benjamin [name], Benjamin [name], Benjamin [name]   100 mg at 11/24/20 0948   topiramate (TOPAMAX) tablet 100 mg  100 mg Oral BID Benjamin Parr, NP   100 mg at 11/24/20 0947   ziprasidone (GEODON) capsule 20 mg  20 mg Oral BID WC Benjamin Parr, NP   20 mg at 11/24/20 0802   ziprasidone (GEODON) injection 20 mg  20 mg Intramuscular Q6H PRN Benjamin Parr, NP   20 mg at 11/23/20 2010   zolpidem (AMBIEN) tablet 10 mg  10 mg Oral QHS Benjamin Parr, NP   10 mg at 11/23/20 2011     Discharge Medications: Please see discharge summary for a list of discharge medications.  Relevant Imaging Results:  Relevant Lab Results:   Additional Information Benjamin [name], MSW, LCSW SSN: [id number]  Benjamin Endo, LCSW

## 2020-11-24 NOTE — Progress Notes (Addendum)
[facility] PROGRESS NOTE  Mitsuru Dault ZTI:[id number] DOB: 04-Jul-1965 DOA: 11/04/2020 PCP: Glenda Chroman, MD  Status: Remains inpatient appropriate because:Altered mental status, Ongoing diagnostic testing needed not appropriate for outpatient work up, Unsafe d/c plan, and IV treatments appropriate due to intensity of illness or inability to take PO  Dispo: The patient is from: Home              Anticipated d/c is to: SNF              Patient currently is not medically stable to d/c.   Difficult to place patient Yes   Level of care: Telemetry Medical  Code Status: Full Family Communication: Wife at bedside 6/21 DVT prophylaxis: Lovenox changed to Eliquis due to refusal COVID vaccination status: Unknown    HPI: 55 y.o. male with medical history significant for seizure disorder who presented to [facility] emergency room with altered mental status.  His wife reported that he had been acting very erratically.  He was complaining of pain in his left foot.  Reportedly he had an injury to his left foot at home in the last day or 2 and has had pain in the foot since then.  He was found to have fracture of his left first toe.  Placed in a postop shoe in the emergency room.  He had an erratic behavior and was very anxious and uncooperative and he was dosed with Haldol Ativan and Benadryl in the emergency room.  Chest x-ray revealed a lingular pneumonia versus a central lung mass.  CT of his chest was obtained which showed a mass in the left lingula and surrounding postobstructive pneumonitis.  The mass extended into the left hilum and he had left hilar adenopathy.  He also had a mass in the right upper lobe.  MRI was obtained to make sure there is no metastasis that would be causing his symptoms.  MRI did not reveal any intracranial lesions.  Due to lack of specialists at [facility] for further work-up so patient was transferred to [facility].  Subjective: Alert and sitting up in chair.   Overall looks much better than he has over the past week.  Continues to report insomnia.  Also reporting nausea and crampy abdominal pain and wife states he has had at least 3 watery stools this morning since her arrival.  Objective: Vitals:   11/23/20 1936 11/24/20 0436  BP: 126/84 110/74  Pulse:  87  Resp: 18   Temp: 98.1 F (36.7 C) 98.4 F (36.9 C)  SpO2: 96% 94%   No intake or output data in the 24 hours ending 11/24/20 0729  Filed Weights   11/04/20 2343 11/12/20 1040  Weight: 51.5 kg 51.5 kg    Exam: General: Alert with bland affect but overall appears much brighter and appropriate. Respiratory: Lungs are clear, stable on room air. Cardiovascular: Heart sounds are normal, no peripheral edema, regular pulse. Abdomen: LBM 6/20, patient reporting crampy abdominal pain patient has diffuse abdominal pain with palpation without any guarding or rebounding. Neurologic: Cranial nerves are intact.  No focal neurological deficits.  Patient able to ambulate independently Psychiatric: Patient is alert and oriented to name and place.  Not to year or situation.  Continues to exhibit short-term memory deficits.  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 -Neurology concerned re paraneoplastic encephalopathy so IVIG initiated on 6/18 with last dose due 6/22  -MRI completed on 6/19 but unable to finish all requested imaging due to lack of patient cooperation -Continue thiamine 100 mg daily -Continue scheduled Vistaril (8 and 4) and HS Vistaril 50 mg.  -Continue Seroquel in favor of twice daily Geodon with as needed IM Geodon for aggressive behavior -After  discussion with pharmacist plan is to resume preadmission Remeron and memantine. -Continue safety sitter due to ongoing issues with impulsivity and wandering  Insomnia -Wife at bedside.  States patient has a very longstanding history of insomnia.  Prior to admission he was taking Xanax 1.5 to 2 mg at bedtime.  We will begin 1.5 mg -Continue at bedtime Vistaril and Ambien   Incidental lung nodule/small cell lung carcinoma -6/16 pathology consistent with [facility] -6/21 Discussed with oncology providers-currently no plans for chemotherapy given debilitated state.  Also not a surgical candidate given debilitated state.  Plan is to consult radiation oncology re evaluation for radiation treatment.  I suspect this is a terminal condition and that radiation will be palliative in nature.  This was also explained to the patient's wife.   Seizure disorder -No seizure since admission and EEGs negative -Continue Keppra, phenobarbital and Topamax  Abdominal pain/recent constipation -Continue MiraLAX as needed daily and Colace 100 mg twice daily scheduled -Wife now states patient having crampy abdominal pain and multiple watery stools; patient also complaining of nausea -Begin scheduled Zofran IV -Check portable abdominal film -No fever or recent antibiotics   Other fracture of left great toe, initial encounter for closed fracture Ambulates without difficulty and denies pain  Severe protein calorie malnutrition 2/2 chronic illness, cancer as evidenced by significant weight loss, severe fat depletion and severe muscle depletion Body mass index is 17.78 kg/m.  Nutrition recommended the addition of Ensure Enlive twice daily     Data Reviewed: Basic Metabolic Panel: Recent Labs  Lab 11/18/20 0333 11/23/20 0428  NA 141 140  K 4.1 4.2  CL 103 104  CO2 28 28  GLUCOSE 109* 112*  BUN 37* 48*  CREATININE 0.89 1.10  CALCIUM 9.6 9.4   Liver Function Tests: Recent Labs  Lab 11/23/20 0428  AST 18   ALT 16  ALKPHOS 68  BILITOT 0.4  PROT 7.6  ALBUMIN 3.2*   No results for input(s): LIPASE, AMYLASE in the last 168 hours. No results for input(s): AMMONIA in the last 168 hours. CBC: Recent Labs  Lab 11/18/20 0333 11/23/20 0428  WBC 7.4 4.4  HGB 10.2* 10.4*  HCT 31.9* 32.3*  MCV 101.6* 100.9*  PLT 312 279   Cardiac Enzymes: No results for input(s): CKTOTAL, CKMB, CKMBINDEX, TROPONINI in the last 168 hours. BNP (last 3 results) No results for input(s): BNP in the last 8760 hours.  ProBNP (last 3 results) No results for input(s): PROBNP in the last 8760 hours.  CBG: No results for input(s): GLUCAP in the last 168 hours.  No results found for this or any previous visit (from the past 240 hour(s)).    Studies: MR BRAIN WO CONTRAST  Result Date: 11/22/2020 CLINICAL DATA:  Neuro deficit, acute, stroke suspected. Seizure disorder. Altered mental status. The examination had to be discontinued prior to completion due to patient refused imaging beyond the diffusion sequences. EXAM: MRI HEAD WITHOUT CONTRAST TECHNIQUE: Multiplanar, multiecho pulse sequences of the brain and surrounding structures were obtained  without intravenous contrast. COMPARISON:  MR head without contrast 07/10/2017. MR head without and with contrast 11/04/2020 at [place]: Brain: Diffusion weighted images demonstrate no acute or subacute infarction. No restricted diffusion is present. IMPRESSION: Patient refused imaging beyond diffusion-weighted sequences. No acute or subacute infarction is present. No cortical restricted diffusion to suggest recent seizure activity. Electronically Signed   By: San Morelle M.D.   On: 11/22/2020 17:01    Scheduled Meds:  docusate sodium  100 mg Oral BID   enoxaparin (LOVENOX) injection  40 mg Subcutaneous Q24H   feeding supplement  237 mL Oral BID BM   hydrOXYzine  25 mg Oral 2 times per day   hydrOXYzine  50 mg Oral QHS   ibuprofen  400 mg Oral QID    levETIRAcetam  500 mg Oral BID   phenobarbital  64.8 mg Oral BID   polyethylene glycol  17 g Oral Daily   thiamine  100 mg Oral Daily   topiramate  100 mg Oral BID   ziprasidone  20 mg Oral BID WC   zolpidem  10 mg Oral QHS   Continuous Infusions:  Immune Globulin 10% Stopped (11/23/20 1900)   lactated ringers Stopped (11/18/20 1115)    Principal Problem:   Encephalopathy acute Active Problems:   Seizure disorder ([place])   Lung neoplasm   Other fracture of left great toe, initial encounter for closed fracture   Encephalopathy   Autoimmune encephalitis   Severe protein-calorie malnutrition [facility])   Consultants: Psychiatry Pulmonary medicine Neurology Oncology Palliative medicine  Procedures: Lumbar puncture performed 6/9  Antibiotics: None   Time spent: 25 minutes    Erin Hearing ANP  [facility] 7 am - 330 pm/M-F for direct patient care and secure chat Please refer to Amion for contact info 20  days

## 2020-11-24 NOTE — Progress Notes (Signed)
Per Dr. Lindi Adie, patient needs to see rad onc.  Will complete referral

## 2020-11-25 ENCOUNTER — Inpatient Hospital Stay (HOSPITAL_COMMUNITY): Payer: Medicaid Other

## 2020-11-25 LAB — MISC LABCORP TEST (SEND OUT): Labcorp test code: 9985

## 2020-11-25 IMAGING — MR MR HEAD WO/W CM
9 of 13 series · 34 of 48 positions shown · IV contrast (gadavist)
Comparison: MRI [DATE] and limited/incomplete MRI [DATE].

CLINICAL DATA: Concern for potential paraneoplastic limbic
encephalitis. Lung mass.

EXAM:
MRI HEAD WITHOUT AND WITH CONTRAST
TECHNIQUE: Multiplanar, multiecho pulse sequences of the brain and surrounding
structures were obtained without and with intravenous contrast.
CONTRAST:  5mL GADAVIST GADOBUTROL 1 MMOL/ML IV SOLN

[Series 3: DWI · axial · 3.0mm · 1.09mm/px · z∈[-53,+100]mm · 9 of 104 slices shown (1 of 4)]
[im 1/104]
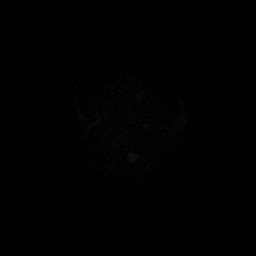
[im 13/104]
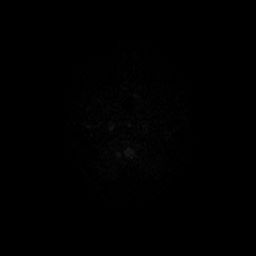
[im 26/104]
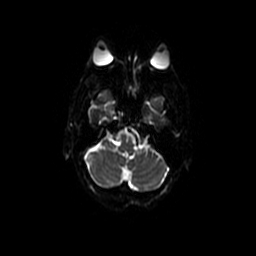
[im 39/104]
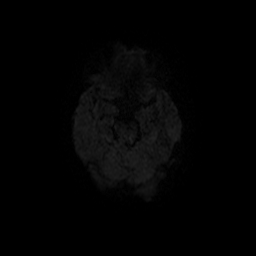
[im 52/104]
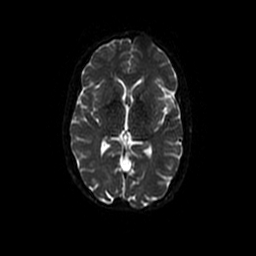
[im 65/104]
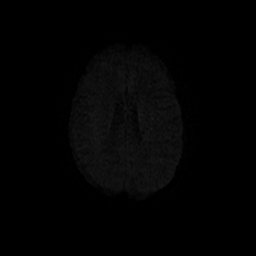
[im 78/104]
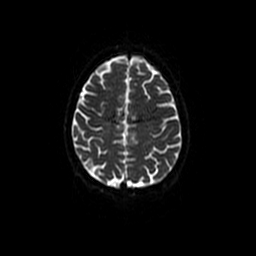
[im 91/104]
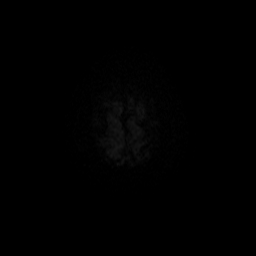
[im 104/104]
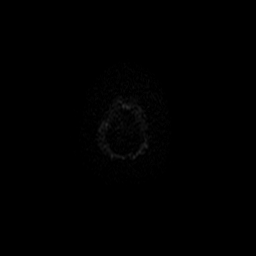

[Series 4: DWI · coronal · 5.0mm · 1.09mm/px · 6 of 78 slices shown (2 of 4)]
[im 1/78]
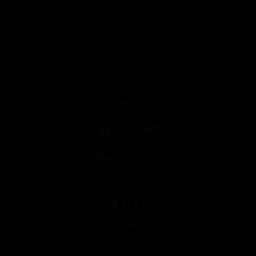
[im 16/78]
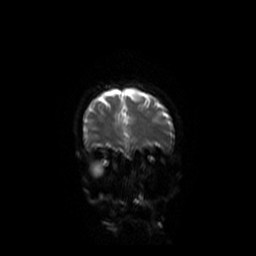
[im 31/78]
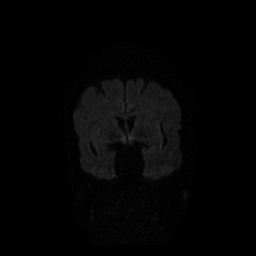
[im 47/78]
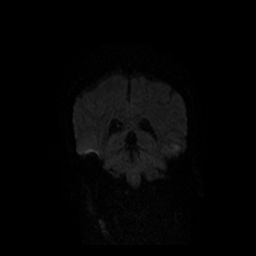
[im 62/78]
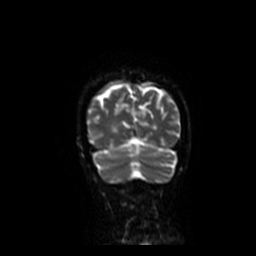
[im 78/78]
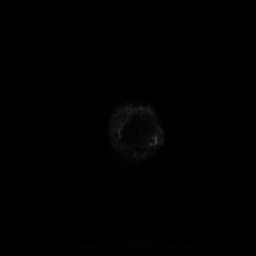

[Series 6: T2 · axial · 5.0mm · 0.43mm/px · z∈[-53,+97]mm · 2 of 26 slices shown]
[im 1/26]
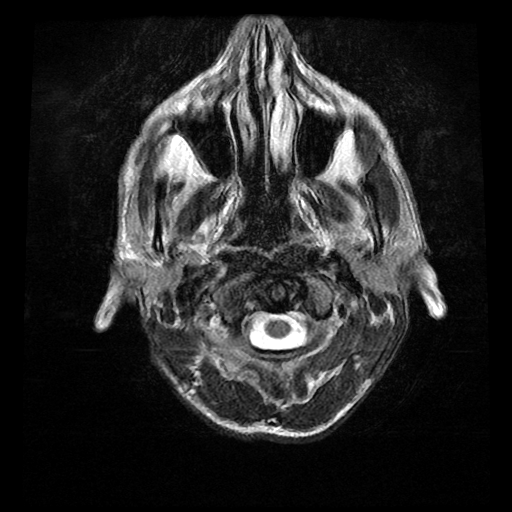
[im 26/26]
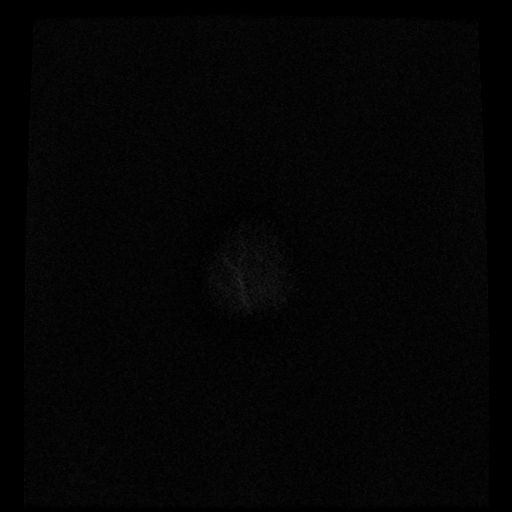

[Series 7: FLAIR · axial · 3.0mm · 0.43mm/px · z∈[-53,+97]mm · 2 of 26 slices shown]
[im 1/26]
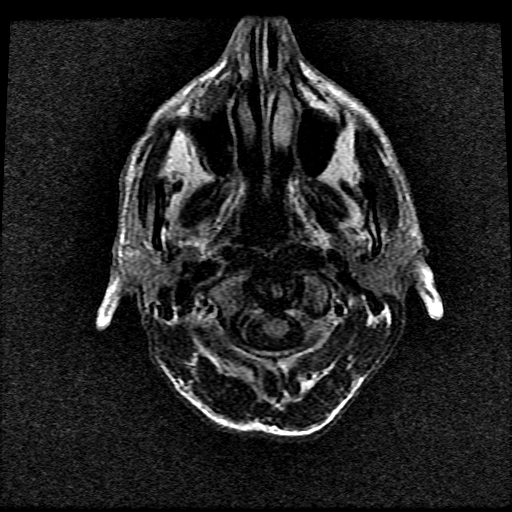
[im 26/26]
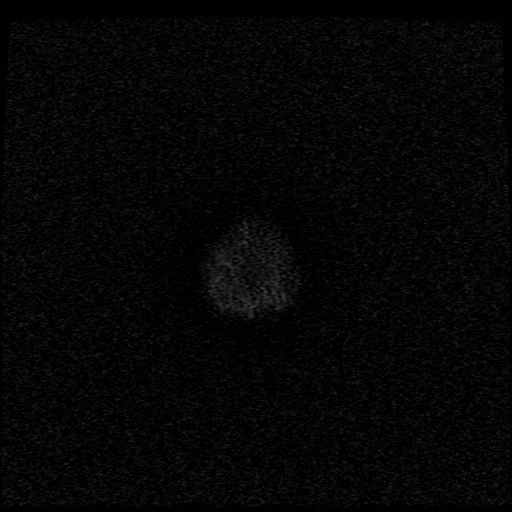

[Series 11: T1 post-contrast · axial · 3.0mm · 0.47mm/px · z∈[-54,+99]mm · 4 of 52 slices shown (1 of 3)]
[im 1/52]
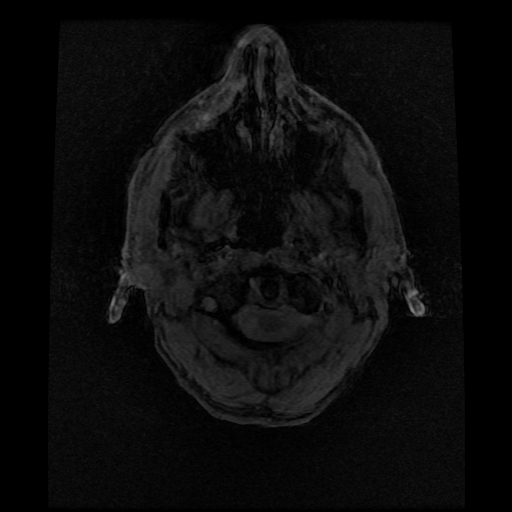
[im 18/52]
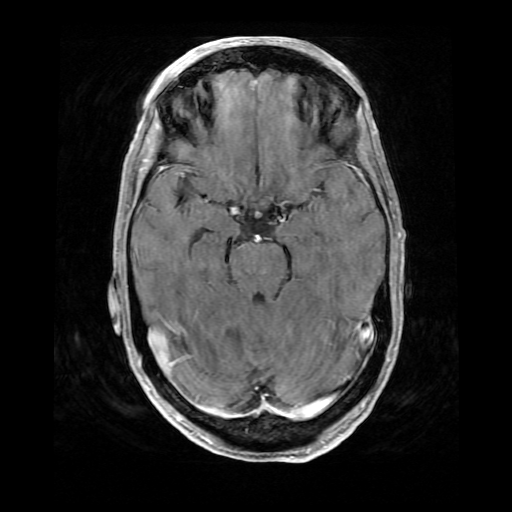
[im 35/52]
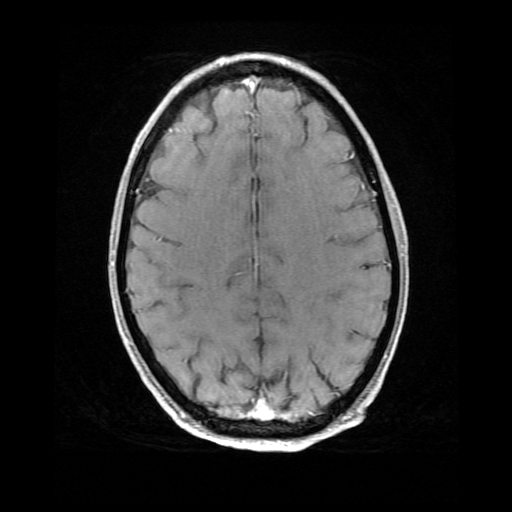
[im 52/52]
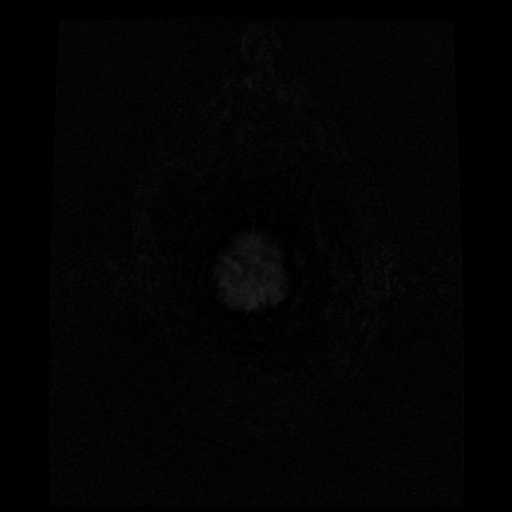

[Series 12: T1 post-contrast · coronal · 5.0mm · 0.39mm/px · 2 of 30 slices shown (2 of 3)]
[im 1/30]
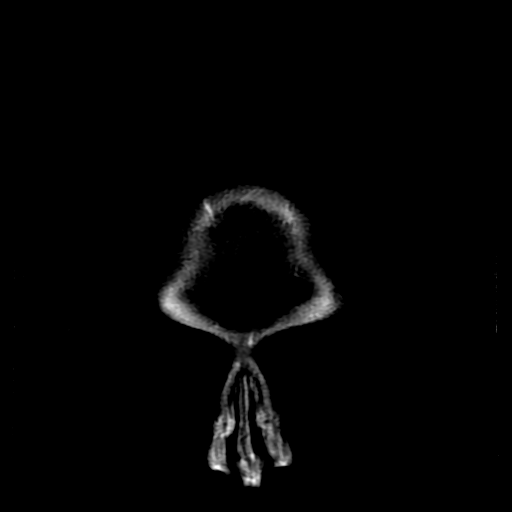
[im 30/30]
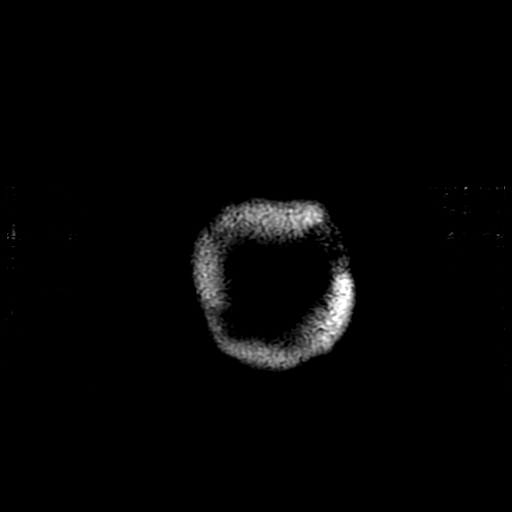

[Series 13: T1 post-contrast · sagittal · 5.0mm · 0.47mm/px · 2 of 23 slices shown (3 of 3)]
[im 1/23]
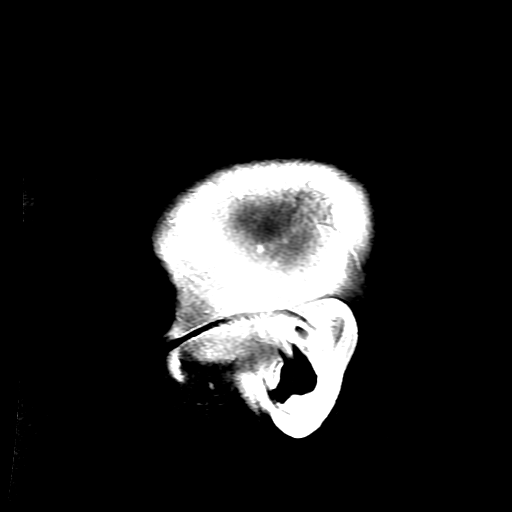
[im 23/23]
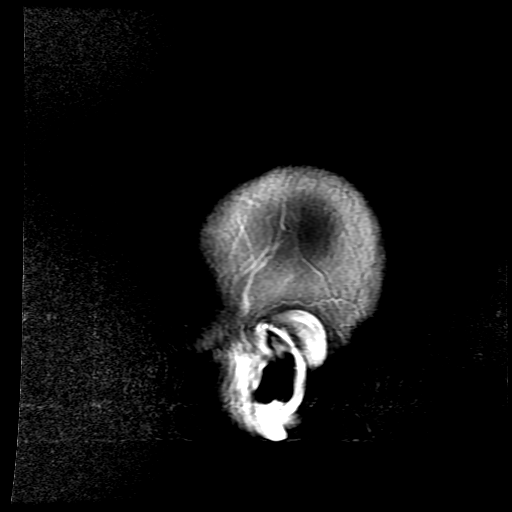

[Series 300: DWI · axial · 3.0mm · 1.09mm/px · z∈[-53,+100]mm · 4 of 52 slices shown (3 of 4)]
[im 1/52]
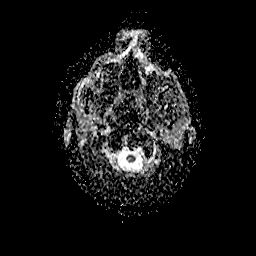
[im 18/52]
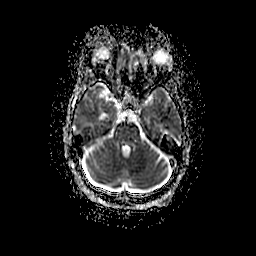
[im 35/52]
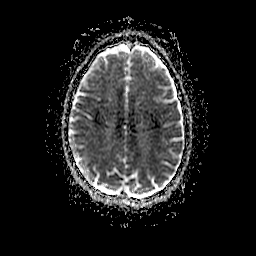
[im 52/52]
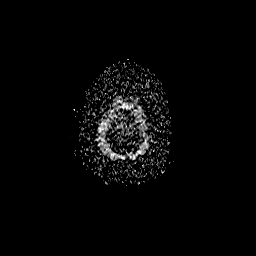

[Series 400: DWI · coronal · 5.0mm · 1.09mm/px · 3 of 39 slices shown (4 of 4)]
[im 1/39]
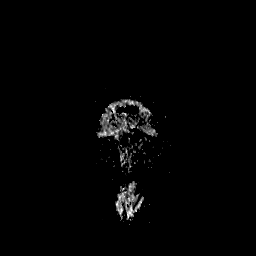
[im 20/39]
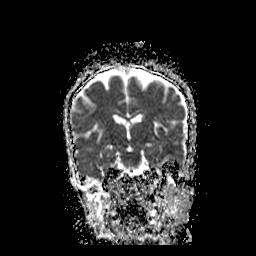
[im 39/39]
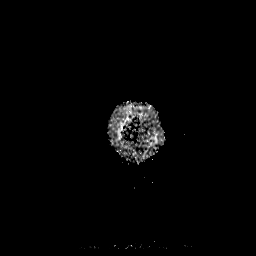

[34 of 48 positions shown; findings below may reference images not displayed]

FINDINGS: Brain: Motion limited diffusion-weighted imaging without evidence of
obvious acute infarct. No evidence of hydrocephalus, acute
hemorrhage, mass lesion, abnormal mass effect, or extra-axial fluid
collection. No abnormal enhancement. Mild periventricular T2/FLAIR
hyperintensities, nonspecific but most likely related to chronic
microvascular ischemic disease. No abnormal enhancement on the
motion limited postcontrast imaging.

Vascular: Major arterial flow voids are maintained at the skull
base.

Skull and upper cervical spine: Normal marrow signal.

Sinuses/Orbits: Sinuses are largely clear.  Unremarkable orbits.

Other: No mastoid effusions.
IMPRESSION: No evidence of acute intracranial abnormality or metastatic disease
on this motion limited study.

## 2020-11-25 MED ORDER — ZOLPIDEM TARTRATE 5 MG PO TABS
5.0000 mg | ORAL_TABLET | Freq: Every day | ORAL | Status: DC
  Administered 2020-11-25 – 2020-11-27 (×3): 5 mg via ORAL
  Filled 2020-11-25 (×3): qty 1

## 2020-11-25 MED ORDER — LACTULOSE 10 GM/15ML PO SOLN
10.0000 g | Freq: Every day | ORAL | Status: DC
  Administered 2020-11-25 – 2020-11-28 (×4): 10 g via ORAL
  Filled 2020-11-25 (×4): qty 15

## 2020-11-25 MED ORDER — GADOBUTROL 1 MMOL/ML IV SOLN
5.0000 mL | Freq: Once | INTRAVENOUS | Status: AC | PRN
  Administered 2020-11-25: 5 mL via INTRAVENOUS

## 2020-11-25 MED ORDER — SENNOSIDES-DOCUSATE SODIUM 8.6-50 MG PO TABS
2.0000 | ORAL_TABLET | Freq: Two times a day (BID) | ORAL | Status: DC
  Administered 2020-11-25 – 2020-11-26 (×3): 2 via ORAL
  Filled 2020-11-25 (×3): qty 2

## 2020-11-25 MED ORDER — LORAZEPAM 2 MG/ML IJ SOLN
2.0000 mg | Freq: Once | INTRAMUSCULAR | Status: AC | PRN
  Administered 2020-11-25: 2 mg via INTRAVENOUS
  Filled 2020-11-25: qty 1

## 2020-11-25 NOTE — Progress Notes (Signed)
Neurology Progress Note  Subjective: Patient with complaints of ongoing diarrhea; states that he had "an accident in the bathroom this morning" Bedside tech and RN note ongoing brief episodes of increased agitation  Patient cooperative with examination this morning with improved orientation  Exam: Vitals:   11/24/20 1731 11/24/20 2017  BP: 106/69 122/80  Pulse: 82 85  Resp: 16 18  Temp: 98.7 F (37.1 C) 98.1 F (36.7 C)  SpO2: 100% 98%   Gen: Cachectic appearing male sitting at bedside, in no acute distress Resp: non-labored breathing, no respiratory distress Abd: soft, non-distended  Neuro: MS: Awake, alert, and oriented to month, year, age, and place. When asked if he recalls what brought him into the hospital he states "there ain't no telling". He is unable to provide a clear and coherent history of present illness.  He has some bradyphrenia noted when responding to examiner questions. No aphasia or dysarthria noted. CN: PERRL, EOMI, visual fields full, face is symmetric resting and smiling, facial sensation to light touch is intact and symmetric, hearing is intact to voice, shoulder shrug is symmetric, palate elevates symmetrically, tongue protrudes midline.  Motor: Moves all extremities spontaneously. Assessment is effort limited.  Sensory: Intact and symmetric to light touch bilaterally in upper and lower extremities.  DTR: 2+ and symmetric patellae and biceps Gait: Steady gait noted without use of assistive device.  Pertinent Labs: CBC    Component Value Date/Time   WBC 4.4 11/23/2020 0428   RBC 3.20 (L) 11/23/2020 0428   HGB 10.4 (L) 11/23/2020 0428   HGB 14.3 01/02/2019 1533   HCT 32.3 (L) 11/23/2020 0428   HCT 41.7 01/02/2019 1533   PLT 279 11/23/2020 0428   PLT 209 01/02/2019 1533   MCV 100.9 (H) 11/23/2020 0428   MCV 99 (H) 01/02/2019 1533   MCH 32.5 11/23/2020 0428   MCHC 32.2 11/23/2020 0428   RDW 12.1 11/23/2020 0428   RDW 12.2 01/02/2019 1533    LYMPHSABS 1.6 11/17/2020 0049   LYMPHSABS 2.0 01/02/2019 1533   MONOABS 0.8 11/17/2020 0049   EOSABS 0.3 11/17/2020 0049   EOSABS 0.2 01/02/2019 1533   BASOSABS 0.1 11/17/2020 0049   BASOSABS 0.1 01/02/2019 1533   CMP     Component Value Date/Time   NA 140 11/23/2020 0428   NA 144 01/02/2019 1533   K 4.2 11/23/2020 0428   CL 104 11/23/2020 0428   CO2 28 11/23/2020 0428   GLUCOSE 112 (H) 11/23/2020 0428   BUN 48 (H) 11/23/2020 0428   BUN 20 01/02/2019 1533   CREATININE 1.10 11/23/2020 0428   CALCIUM 9.4 11/23/2020 0428   PROT 7.6 11/23/2020 0428   PROT 6.6 01/02/2019 1533   ALBUMIN 3.2 (L) 11/23/2020 0428   ALBUMIN 4.0 11/12/2020 1230   AST 18 11/23/2020 0428   ALT 16 11/23/2020 0428   ALKPHOS 68 11/23/2020 0428   BILITOT 0.4 11/23/2020 0428   BILITOT <0.2 01/02/2019 1533   GFRNONAA >60 11/23/2020 0428   GFRAA 95 01/02/2019 1533   No results found for: CHOL, HDL, LDLCALC, LDLDIRECT, TRIG, CHOLHDL  Imaging Reviewed:  Repeat MRI brain 11/22/2020: FINDINGS: Brain: Diffusion weighted images demonstrate no acute or subacute infarction. No restricted diffusion is present.   IMPRESSION: Patient refused imaging beyond diffusion-weighted sequences. No acute or subacute infarction is present. No cortical restricted diffusion to suggest recent seizure activity.   Assessment: 55 yo male who was originally admitted from OSH for a lung mass and psychotic type behavior. Neurology was  consulted for opinion on paraneoplastic syndrome and limbic encephalopathy. Empiric IVIG therapy x 5 days initiated on 11/21/2020 with last dose on 11/25/2020. - Examination reveals patient with minimal improvement in mental status with orientation to person, age, place, month, and year. Bedside sitter endorses some continued episodes of agitation but without evidence of agitation during examination. Patient with complaints of diarrhea and persistent insomnia. - Solumedrol was not preferred method of  treatment due to severe agitation / psychosis on hospital arrival and intermittent agitation with anger outbursts and insomnia since hospital arrival.  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  - LP completed during hospitalization with elevated protein which may be due to meningeal infiltrative process given his pulmonary mass versus paraneoplastic inflammation. Paraneoplastic inflammation was felt to be the less likely etiology due to a normal IgG index. - MRI brain repeated 11/22/2020 to assess for possible abnormal meningeal enhancement, however, patient refused images beyond diffusion-weighted sequences.   Recommendations: - IVIG day 5/5 to be completed today - Continue vitamin B12 supplementation - Would recommend attempting repeat MRI Brain with and without contrast.  Anibal Henderson, AGACNP-BC [facility] [phone]   NEUROHOSPITALIST ADDENDUM Performed a face to face diagnostic evaluation.   I have reviewed the contents of history and physical exam as documented by PA/ARNP/Resident and agree with above documentation.  I have discussed and formulated the above plan as documented. Edits to the note have been made as needed.  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 Workup with LP with mildly elevated protein which may just be meningeal infiltrative process. Low suspicion for paraneoplastic process with negative paraneoplastic panel, normal IgG index and no oligoclonal bands. Agitation improved in the hospital prior to any immunotherapy. We went ahead and treated him with  IVIG and his last dose was completed today. Continues to be calm.  Would recommend attempting repeat MRI Brain with and without contrast now that he is much more calm.  Donnetta Simpers, MD [facility] [phone]   If 7pm to 7am, please call on call as listed on AMION.

## 2020-11-25 NOTE — Progress Notes (Signed)
[facility] PROGRESS NOTE  Benjamin [name] KGU:[id number] DOB: 1966/01/25 DOA: 11/04/2020 PCP: Glenda Chroman, MD  Status: Remains inpatient appropriate because:Altered mental status, Ongoing diagnostic testing needed not appropriate for outpatient work up, Unsafe d/c plan, and IV treatments appropriate due to intensity of illness or inability to take PO  Dispo: The patient is from: Home              Anticipated d/c is to: SNF              Patient currently is not medically stable to d/c.   Difficult to place patient Yes   Level of care: Telemetry Medical  Code Status: Full Family Communication: Wife at bedside 6/21 DVT prophylaxis: Lovenox changed to Eliquis due to refusal COVID vaccination status: Unknown    HPI: 55 y.o. male with medical history significant for seizure disorder who presented to [facility] emergency room with altered mental status.  His wife reported that he had been acting very erratically.  He was complaining of pain in his left foot.  Reportedly he had an injury to his left foot at home in the last day or 2 and has had pain in the foot since then.  He was found to have fracture of his left first toe.  Placed in a postop shoe in the emergency room.  He had an erratic behavior and was very anxious and uncooperative and he was dosed with Haldol Ativan and Benadryl in the emergency room.  Chest x-ray revealed a lingular pneumonia versus a central lung mass.  CT of his chest was obtained which showed a mass in the left lingula and surrounding postobstructive pneumonitis.  The mass extended into the left hilum and he had left hilar adenopathy.  He also had a mass in the right upper lobe.  MRI was obtained to make sure there is no metastasis that would be causing his symptoms.  MRI did not reveal any intracranial lesions.  Due to lack of specialists at [facility] for further work-up so patient was transferred to [facility].  Subjective: Very drowsy this morning and  is oriented.  Staff stated patient had been up and down having liquid bowel movements during the night.  He was also resumed on his high-dose bedtime Xanax.  Objective: Vitals:   11/24/20 1731 11/24/20 2017  BP: 106/69 122/80  Pulse: 82 85  Resp: 16 18  Temp: 98.7 F (37.1 C) 98.1 F (36.7 C)  SpO2: 100% 98%   No intake or output data in the 24 hours ending 11/25/20 0748  Filed Weights   11/04/20 2343 11/12/20 1040  Weight: 51.5 kg 51.5 kg    Exam: General: Very drowsy but appears to be in no acute distress. Respiratory: Posterior lung sounds remain clear, stable on room air without any increased work of breathing while positioned supine in the bed Cardiovascular: Heart sounds are S1-S2, patient is normotensive, no peripheral edema. Abdomen: LBM 6/20 as documented in the flow record.  It has been reported since yesterday patient has had frequent liquid stools in the context of large volume of retained stool in the right colon.  Bowel sounds are present and no abdominal pain with palpation. Neurologic: Cranial nerves are intact.  No focal neurological deficits.  Patient able to ambulate independently at baseline Psychiatric: Drowsy and awakens.  Unable to answer orientation questions appropriately stated he was at "[facility]".  All other questions answered incorrectly as well.  Fell back to sleep prior to  end of examination.   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 -Neurology concerned re paraneoplastic encephalopathy - IVIG will be completed today 6/22  -MRI completed on 6/19 but unable to finish all requested imaging due to lack of patient cooperation -Continue thiamine 100 mg daily -6/22 given  significant drowsiness today we will discontinue daytime Vistaril but continue HS Vistaril 50 mg.  -Continue Seroquel in favor of twice daily Geodon with as needed IM Geodon for aggressive behavior -After discussion with pharmacist plan is to resume preadmission Remeron and memantine. -Continue safety sitter due to ongoing issues with impulsivity and wandering  Insomnia -Wife at bedside.  States patient has a very longstanding history of insomnia.  Prior to admission he was taking Xanax 1.5 to 2 mg at bedtime.  We will begin 1.5 mg -Continue at bedtime Vistaril-very drowsy today on 6/22 to resumption of preadmission Xanax.  Will decrease Ambien to 5 mg.  If remains drowsy tomorrow morning we will discontinue Ambien   Incidental lung nodule/small cell lung carcinoma -6/16 pathology consistent with [place] -6/22 discussed with oncology providers the remainder of the malignancy work-up will be completed in the outpatient setting.  Unfortunately a PET scan can only be done in the outpatient setting.   -Likely not a surgical candidate given debilitated state.   -PMT evaluation pending.  Suspect his palliative performance scores will be very low will likely influence aggressiveness of cancer treatment.   Seizure disorder -No seizure since admission and EEGs negative -Continue Keppra, phenobarbital and Topamax  Abdominal pain/recent constipation -X-ray demonstrates to large volume of retained stool -Continues with nausea and some liquid stool likely related to body trying to eliminate retained fecal material -Continue scheduled Zofran -Continue MiraLAX daily, change Colace to Senokot and add daily low-dose Chronulac   Other fracture of left great toe, initial encounter for closed fracture Ambulates without difficulty and denies pain  Severe protein calorie malnutrition 2/2 chronic illness, cancer as evidenced by significant weight loss, severe fat depletion and severe muscle depletion Body mass  index is 17.78 kg/m.  Nutrition recommended the addition of Ensure Enlive twice daily     Data Reviewed: Basic Metabolic Panel: Recent Labs  Lab 11/23/20 0428  NA 140  K 4.2  CL 104  CO2 28  GLUCOSE 112*  BUN 48*  CREATININE 1.10  CALCIUM 9.4   Liver Function Tests: Recent Labs  Lab 11/23/20 0428  AST 18  ALT 16  ALKPHOS 68  BILITOT 0.4  PROT 7.6  ALBUMIN 3.2*   No results for input(s): LIPASE, AMYLASE in the last 168 hours. No results for input(s): AMMONIA in the last 168 hours. CBC: Recent Labs  Lab 11/23/20 0428  WBC 4.4  HGB 10.4*  HCT 32.3*  MCV 100.9*  PLT 279   Cardiac Enzymes: No results for input(s): CKTOTAL, CKMB, CKMBINDEX, TROPONINI in the last 168 hours. BNP (last 3 results) No results for input(s): BNP in the last 8760 hours.  ProBNP (last 3 results) No results for input(s): PROBNP in the last 8760 hours.  CBG: No results for input(s): GLUCAP in the last 168 hours.  No results found for this or any previous visit (from the past 240 hour(s)).    Studies: DG Abd Portable 1V  Result Date: 11/24/2020 CLINICAL DATA:  Constipation. EXAM:  PORTABLE ABDOMEN - 1 VIEW COMPARISON:  No recent prior. FINDINGS: Soft tissue structures are unremarkable. Moderate stool volume. Mild gastric distention cannot be excluded. No small bowel or colonic distention. No free air. Prominent thoracolumbar spine scoliosis and degenerative change. Degenerative changes both hips. IMPRESSION: 1. Moderate stool volume. No small-bowel colonic distention. No free air. 2. Mild gastric distention cannot be excluded. Electronically Signed   By: Marcello Moores  Register   On: 11/24/2020 15:43    Scheduled Meds:  ALPRAZolam  1.5 mg Oral QHS   apixaban  2.5 mg Oral BID   docusate sodium  100 mg Oral BID   feeding supplement  237 mL Oral BID BM   hydrOXYzine  25 mg Oral 2 times per day   hydrOXYzine  50 mg Oral QHS   ibuprofen  400 mg Oral QID   levETIRAcetam  500 mg Oral BID    memantine  5 mg Oral BID   mirtazapine  15 mg Oral QHS   ondansetron (ZOFRAN) IV  4 mg Intravenous Q6H   phenobarbital  129.6 mg Oral BID   polyethylene glycol  17 g Oral Daily   thiamine  100 mg Oral Daily   topiramate  100 mg Oral BID   ziprasidone  20 mg Oral BID WC   zolpidem  10 mg Oral QHS   Continuous Infusions:  Immune Globulin 10% Stopped (11/24/20 1800)   lactated ringers Stopped (11/18/20 1115)    Principal Problem:   Encephalopathy acute Active Problems:   Seizure disorder ([place])   Lung neoplasm   Other fracture of left great toe, initial encounter for closed fracture   Encephalopathy   Autoimmune encephalitis   Severe protein-calorie malnutrition [facility])   Consultants: Psychiatry Pulmonary medicine Neurology Oncology Palliative medicine  Procedures: Lumbar puncture performed 6/9  Antibiotics: None   Time spent: 25 minutes    Erin Hearing ANP  [facility] 7 am - 330 pm/M-F for direct patient care and secure chat Please refer to Amion for contact info 21  days

## 2020-11-25 NOTE — Progress Notes (Addendum)
Palliative:  Consult received and chart reviewed.  Family not at bedside. Call to wife with no answer - left voicemail with call back number.  Will continue to attempt to schedule goals of care meeting.  Addendum: Wife returned call - she is working today and Architectural technologist. Requested to meet Friday at 9 am.  Juel Burrow, DNP, [facility] Palliative Medicine Team Team Phone # [phone]  Pager # [phone]  NO CHARGE

## 2020-11-26 ENCOUNTER — Encounter: Payer: Self-pay | Admitting: *Deleted

## 2020-11-26 ENCOUNTER — Other Ambulatory Visit: Payer: Self-pay

## 2020-11-26 ENCOUNTER — Other Ambulatory Visit: Payer: Self-pay | Admitting: *Deleted

## 2020-11-26 DIAGNOSIS — D491 Neoplasm of unspecified behavior of respiratory system: Secondary | ICD-10-CM

## 2020-11-26 MED ORDER — ENSURE ENLIVE PO LIQD
237.0000 mL | Freq: Four times a day (QID) | ORAL | Status: DC
  Administered 2020-11-26 – 2020-11-28 (×8): 237 mL via ORAL

## 2020-11-26 MED ORDER — ADULT MULTIVITAMIN W/MINERALS CH
1.0000 | ORAL_TABLET | Freq: Every day | ORAL | Status: DC
  Administered 2020-11-26 – 2020-11-27 (×2): 1 via ORAL
  Filled 2020-11-26 (×2): qty 1

## 2020-11-26 NOTE — Progress Notes (Addendum)
[facility] PROGRESS NOTE  Benjamin [name] EGB:[id number] DOB: 1966/02/24 DOA: 11/04/2020 PCP: Glenda Chroman, MD  Status: Remains inpatient appropriate because:Altered mental status, Ongoing diagnostic testing needed not appropriate for outpatient work up, Unsafe d/c plan, and IV treatments appropriate due to intensity of illness or inability to take PO  Dispo: The patient is from: Home              Anticipated d/c is to: SNF              Patient currently is not medically stable to d/c.   Difficult to place patient Yes   Level of care: Telemetry Medical  Code Status: Full Family Communication: Wife at bedside 6/23 DVT prophylaxis: Lovenox changed to Eliquis due to refusal to take injection COVID vaccination status: Unknown    HPI: 55 y.o. male with medical history significant for seizure disorder who presented to [facility] emergency room with altered mental status.  His wife reported that he had been acting very erratically.  He was complaining of pain in his left foot.  Reportedly he had an injury to his left foot at home in the last day or 2 and has had pain in the foot since then.  He was found to have fracture of his left first toe.  Placed in a postop shoe in the emergency room.  He had an erratic behavior and was very anxious and uncooperative and he was dosed with Haldol Ativan and Benadryl in the emergency room.  Chest x-ray revealed a lingular pneumonia versus a central lung mass.  CT of his chest was obtained which showed a mass in the left lingula and surrounding postobstructive pneumonitis.  The mass extended into the left hilum and he had left hilar adenopathy.  He also had a mass in the right upper lobe.  MRI was obtained to make sure there is no metastasis that would be causing his symptoms.  MRI did not reveal any intracranial lesions.  Due to lack of specialists at [facility] for further work-up so patient was transferred to [facility].  Subjective: Sitting on  side of bed.  Wife in chair beside him.  They are holding hands.  Noted he is much more calm when wife is present.  He is unable to stay focused on conversation and remains pleasantly confused.  Perseverates on the topic of Ensure beverage.  I told him he could have as many as he wanted especially if he did not wish to eat solid food.  He continued to ask "can I have as many as I want".  Wife updated on oncology plans to complete malignancy work-up in the outpatient setting and explained rationale for why PET scan cannot be completed in the inpatient setting.  She also confirmed meeting with palliative medicine team tomorrow.  Objective: Vitals:   11/26/20 0007 11/26/20 0432  BP: 109/74 110/68  Pulse: 85 78  Resp: 18 18  Temp: 98.2 F (36.8 C) 98 F (36.7 C)  SpO2: 98% 98%    Intake/Output Summary (Last 24 hours) at 11/26/2020 0714 Last data filed at 11/26/2020 0500 Gross per 24 hour  Intake 250 ml  Output 420 ml  Net -170 ml    Filed Weights   11/04/20 2343 11/12/20 1040  Weight: 51.5 kg 51.5 kg    Exam: General: Very cachectic and malnourished in appearance, calm today Respiratory: Posterior lung sounds clear.  No increased work of breathing.  Stable on room air, O2 saturations 98 to  100% Cardiovascular: Heart sounds are normal, pulses regular, he is normotensive without peripheral edema. Abdomen: LBM 6/23, soft and nontender.  Continues to deny nausea or abdominal pain.  No apparent further watery stools. Neurologic: Cranial nerves are intact.  No focal neurological deficits.  Patient able to ambulate independently at baseline Psychiatric: Alert and very confused but pleasant.  Perseverates on Ensure beverage availability.  Continues to demonstrate significant short-term memory deficits.   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  Neurology has signed off -Continue thiamine 100 mg daily -6/23 more awake today after discontinuation of daytime doses of scheduled Vistaril -Discontinued Seroquel on 6/22.  Continue oral Geodon with prn IM Geodon for significant behavioral changes -Continue preadmission Remeron and memantine. -Behaviors have improved significantly and nursing states he no longer require safety sitter-order discontinued on 6/23  Insomnia -Longstanding history of intractable insomnia.  Continue Xanax 1.5 mg (preadmission dose) -Continue HS Vistaril with Ambien 5 mg  Incidental lung nodule/small cell lung carcinoma -6/16 pathology consistent with [facility] -6/22 discussed with oncology providers the remainder of the malignancy work-up will be completed in the outpatient setting.  Unfortunately a PET scan can only be done in the outpatient setting.   -Likely not a surgical candidate given debilitated state.   -PMT evaluation pending on 6/24.     Seizure disorder -No seizure since admission and EEGs negative -Continue Keppra, phenobarbital and Topamax  Abdominal pain/recent constipation -6/22 x-ray demonstrates to large volume of retained stool -Continue scheduled Zofran, scheduled MiraLAX, Senokot and low-dose Chronulac   Other fracture of left great toe, initial encounter for closed fracture Ambulates without difficulty and denies pain  Severe protein calorie malnutrition 2/2 chronic illness, cancer as evidenced by significant weight loss, severe fat depletion and severe muscle depletion Body mass index is 17.78 kg/m.  Nutrition recommended the addition of Ensure Enlive twice daily     Data Reviewed: Basic Metabolic Panel: Recent  Labs  Lab 11/23/20 0428  NA 140  K 4.2  CL 104  CO2 28  GLUCOSE 112*  BUN 48*  CREATININE 1.10  CALCIUM 9.4   Liver Function Tests: Recent Labs  Lab 11/23/20 0428  AST 18  ALT 16  ALKPHOS 68  BILITOT 0.4  PROT 7.6  ALBUMIN 3.2*   No results for input(s): LIPASE, AMYLASE in the last 168 hours. No results for input(s): AMMONIA in the last 168 hours. CBC: Recent Labs  Lab 11/23/20 0428  WBC 4.4  HGB 10.4*  HCT 32.3*  MCV 100.9*  PLT 279   Cardiac Enzymes: No results for input(s): CKTOTAL, CKMB, CKMBINDEX, TROPONINI in the last 168 hours. BNP (last 3 results) No results for input(s): BNP in the last 8760 hours.  ProBNP (last 3 results) No results for input(s): PROBNP in the last 8760 hours.  CBG: No results for input(s): GLUCAP in the last 168 hours.  No results found for this or any previous visit (from the past 240 hour(s)).    Studies: MR BRAIN W WO CONTRAST  Result Date: 11/25/2020 CLINICAL DATA:  Concern for potential paraneoplastic limbic encephalitis. Lung mass. EXAM: MRI HEAD WITHOUT AND WITH CONTRAST TECHNIQUE: Multiplanar, multiecho pulse sequences of the brain and surrounding structures were  obtained without and with intravenous contrast. CONTRAST:  86mL GADAVIST GADOBUTROL 1 MMOL/ML IV SOLN COMPARISON:  MRI 11/04/2020 and limited/incomplete MRI 11/22/2020. FINDINGS: Brain: Motion limited diffusion-weighted imaging without evidence of obvious acute infarct. No evidence of hydrocephalus, acute hemorrhage, mass lesion, abnormal mass effect, or extra-axial fluid collection. No abnormal enhancement. Mild periventricular T2/FLAIR hyperintensities, nonspecific but most likely related to chronic microvascular ischemic disease. No abnormal enhancement on the motion limited postcontrast imaging. Vascular: Major arterial flow voids are maintained at the skull base. Skull and upper cervical spine: Normal marrow signal. Sinuses/Orbits: Sinuses are largely clear.   Unremarkable orbits. Other: No mastoid effusions. IMPRESSION: No evidence of acute intracranial abnormality or metastatic disease on this motion limited study. Electronically Signed   By: Margaretha Sheffield MD   On: 11/25/2020 13:54   DG Abd Portable 1V  Result Date: 11/24/2020 CLINICAL DATA:  Constipation. EXAM: PORTABLE ABDOMEN - 1 VIEW COMPARISON:  No recent prior. FINDINGS: Soft tissue structures are unremarkable. Moderate stool volume. Mild gastric distention cannot be excluded. No small bowel or colonic distention. No free air. Prominent thoracolumbar spine scoliosis and degenerative change. Degenerative changes both hips. IMPRESSION: 1. Moderate stool volume. No small-bowel colonic distention. No free air. 2. Mild gastric distention cannot be excluded. Electronically Signed   By: Marcello Moores  Register   On: 11/24/2020 15:43    Scheduled Meds:  ALPRAZolam  1.5 mg Oral QHS   apixaban  2.5 mg Oral BID   feeding supplement  237 mL Oral BID BM   hydrOXYzine  50 mg Oral QHS   ibuprofen  400 mg Oral QID   lactulose  10 g Oral Daily   levETIRAcetam  500 mg Oral BID   memantine  5 mg Oral BID   mirtazapine  15 mg Oral QHS   ondansetron (ZOFRAN) IV  4 mg Intravenous Q6H   phenobarbital  129.6 mg Oral BID   polyethylene glycol  17 g Oral Daily   senna-docusate  2 tablet Oral BID   thiamine  100 mg Oral Daily   topiramate  100 mg Oral BID   ziprasidone  20 mg Oral BID WC   zolpidem  5 mg Oral QHS   Continuous Infusions:    Principal Problem:   Encephalopathy acute Active Problems:   Seizure disorder ([place])   Lung neoplasm   Other fracture of left great toe, initial encounter for closed fracture   Encephalopathy   Autoimmune encephalitis   Severe protein-calorie malnutrition [facility])   Consultants: Psychiatry Pulmonary medicine Neurology Oncology Palliative medicine  Procedures: Lumbar puncture performed 6/9  Antibiotics: None   Time spent: 25 minutes    Erin Hearing  ANP  [facility] 7 am - 330 pm/M-F for direct patient care and secure chat Please refer to Amion for contact info 22  days

## 2020-11-26 NOTE — TOC Progression Note (Addendum)
Transition of Care [facility]) - Progression Note    Patient Details  Name: Benjamin [name] MRN: [id number] Date of Birth: [birth year]  Transition of Care [facility]) CM/SW [place], RN Phone Number: 11/26/2020, 2:04 PM  Clinical Narrative:    Case management spoke with [facility], CM with admission at [facility] and she is currently reviewing the patient's clinicals for possible admission to the facility.  CM and MSW will continue to follow the patient for SNF admission for LTC placement.  CM faxed the patient's clinical out in the hub to numerous [place] facilities in [place] and [place] SNF facilities.  I also faxed the patient's clinicals to facilities in [place], [place] area as well, since the patient will have outpatient follow up at [place] clinics.  I spoke with [facility], CM on the phone at [facility] and she is currently reviewing the patient for admission.   Expected Discharge Plan: [place] Barriers to Discharge: Continued Medical Work up  Expected Discharge Plan and Services Expected Discharge Plan: [place] In-house Referral: Clinical Social Work Discharge Planning Services: CM Consult Post Acute Care Choice: [place] Living arrangements for the past 2 months: Mobile Home                                       Social Determinants of Health (SDOH) Interventions    Readmission Risk Interventions No flowsheet data found.

## 2020-11-26 NOTE — Progress Notes (Signed)
Brief Neuro Update and Signoff note:  Briefly, Mr. Tramane Gorum is a 23M hx of seizures due to TBI in 1986, ?hx of shchizoaffective disorder vs schizophrenia who is admitted from OSH with a lung mass and psychosis with some suspicion for potential paraneoplastic syndrome vs limbic encephalitis.   Workup for a potential autoimmune/paraneoplastic cause to his presentation with LP with mildly elevated protein which may just be meningeal infiltrative process. Negative paraneoplastic panel, normal IgG index and no oligoclonal bands. MRI Brain with and without contrast which was motion degraded but no obvious abnormality, no enhancement.  Agitation improved in the hospital prior to any immunotherapy. We went ahead and treated him with IVIG and his last dose was completed today. Continues to be calm.  I am not entirely convinced that this is a paraneoplastic process. 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   I do not have an organic explanation for his presentation. We do not have much to offer at this point. We will signoff on him. Recommend follow up with Cognitive Neurology clinic outpatient.  [place] Pager Number [phone]

## 2020-11-26 NOTE — Progress Notes (Signed)
Nutrition Follow-up  DOCUMENTATION CODES:   Severe malnutrition in context of chronic illness, Underweight  INTERVENTION:  -Ensure Enlive po QID, each supplement provides 350 kcal and 20 grams of protein -continue double protein portions -MVI with minerals daily  NUTRITION DIAGNOSIS:   Severe Malnutrition related to chronic illness, cancer and cancer related treatments as evidenced by percent weight loss, severe fat depletion, severe muscle depletion.  ongoing  GOAL:   Patient will meet greater than or equal to 90% of their needs  progressing  MONITOR:   PO intake, Supplement acceptance, Labs, Weight trends, Skin, I & O's  REASON FOR ASSESSMENT:   Consult Assessment of nutrition requirement/status  ASSESSMENT:   55 yo male with a PMH of L lung mass and tobacco use who presents with mediastinal adenopthy.  6/15 - bronchoscopy with endobronchial ultrasound and biopsies 6/16 -pathology consistent with [facility] 6/22 - decision was made with oncology providers that remainder of the malignancy work-up will be completed in the outpatient setting  Pt difficult to obtain history from, remains pleasantly confused. Per RN, pt has had a good appetite, but is doing particularly well with Ensure supplements and has been asking if he can have as many as he would like (MD said yes to request). Will adjust supplement orders accordingly. Noted Palliative Medicine has scheduled a meeting to discuss [place] tomorrow morning.   Pt was initiated on bowel regimen given abdominal pain/recent constipation and abdominal xray yesterday revealing large volume of retained stool   PO Intake: 10-100% x last 8 recorded meals (79% average meal intake)  UOP: 434ml + 4x unmeasured occurrences x24 hours Stool: 4x unmeasured occurrences x24 hours I/O: +5762ml since admit  No weight obtained since 11/12/20.   Medications:  ALPRAZolam  1.5 mg Oral QHS   apixaban  2.5 mg Oral BID   feeding supplement  237 mL  Oral QID   hydrOXYzine  50 mg Oral QHS   ibuprofen  400 mg Oral QID   lactulose  10 g Oral Daily   levETIRAcetam  500 mg Oral BID   memantine  5 mg Oral BID   mirtazapine  15 mg Oral QHS   multivitamin with minerals  1 tablet Oral Daily   ondansetron (ZOFRAN) IV  4 mg Intravenous Q6H   phenobarbital  129.6 mg Oral BID   polyethylene glycol  17 g Oral Daily   senna-docusate  2 tablet Oral BID   thiamine  100 mg Oral Daily   topiramate  100 mg Oral BID   ziprasidone  20 mg Oral BID WC   zolpidem  5 mg Oral QHS   Labs: Recent Labs  Lab 11/23/20 0428  NA 140  K 4.2  CL 104  CO2 28  BUN 48*  CREATININE 1.10  CALCIUM 9.4  GLUCOSE 112*   Diet Order:   Diet Order             Diet regular Room service appropriate? Yes; Fluid consistency: Thin  Diet effective now                   EDUCATION NEEDS:   Education needs have been addressed  Skin:  Skin Assessment: Skin Integrity Issues: Skin Integrity Issues:: Other (Comment) Other: Skin tear, L arm and non-pressure wound, L toe  Last BM:  6/23  Height:   Ht Readings from Last 1 Encounters:  11/12/20 5\' 7"  (1.702 m)    Weight:   Wt Readings from Last 1 Encounters:  11/12/20 51.5  kg    Ideal Body Weight:  67.3 kg  BMI:  Body mass index is 17.78 kg/m.  Estimated Nutritional Needs:   Kcal:  7544-9201  Protein:  85-100 grams  Fluid:  >2 L    Larkin Ina, MS, RD, LDN (she/her/hers) RD pager number and weekend/on-call pager number located in [place].

## 2020-11-26 NOTE — Progress Notes (Signed)
Per Dr. Valeta Harms referral to oncology at [facility] completed.

## 2020-11-26 NOTE — Progress Notes (Signed)
The proposed treatment discussed in cancer conference 11/26/20 is for discussion purpose only and is not a binding recommendation.  The patient was not physically examined nor present for their treatment options. Therefore, final treatment plans cannot be decided.

## 2020-11-27 DIAGNOSIS — Z66 Do not resuscitate: Secondary | ICD-10-CM

## 2020-11-27 DIAGNOSIS — G9340 Encephalopathy, unspecified: Secondary | ICD-10-CM

## 2020-11-27 DIAGNOSIS — Z7189 Other specified counseling: Secondary | ICD-10-CM

## 2020-11-27 DIAGNOSIS — Z515 Encounter for palliative care: Secondary | ICD-10-CM

## 2020-11-27 DIAGNOSIS — D491 Neoplasm of unspecified behavior of respiratory system: Secondary | ICD-10-CM

## 2020-11-27 MED ORDER — ACETAMINOPHEN 650 MG RE SUPP: 650.0000 mg | Freq: Four times a day (QID) | RECTAL | Status: DC | PRN

## 2020-11-27 MED ORDER — SODIUM CHLORIDE 0.9 % IV SOLN: 250.0000 mL | INTRAVENOUS | Status: DC | PRN

## 2020-11-27 MED ORDER — GLYCOPYRROLATE 0.2 MG/ML IJ SOLN: 0.2000 mg | INTRAMUSCULAR | Status: DC | PRN

## 2020-11-27 MED ORDER — HALOPERIDOL LACTATE 5 MG/ML IJ SOLN: 0.5000 mg | INTRAMUSCULAR | Status: DC | PRN

## 2020-11-27 MED ORDER — HALOPERIDOL LACTATE 2 MG/ML PO CONC
0.5000 mg | ORAL | Status: DC | PRN
  Filled 2020-11-27: qty 0.3

## 2020-11-27 MED ORDER — HALOPERIDOL 1 MG PO TABS
0.5000 mg | ORAL_TABLET | ORAL | Status: DC | PRN
Start: 2020-11-27 — End: 2020-11-28

## 2020-11-27 MED ORDER — MORPHINE SULFATE (CONCENTRATE) 10 MG/0.5ML PO SOLN: 5.0000 mg | ORAL | Status: DC | PRN

## 2020-11-27 MED ORDER — GLYCOPYRROLATE 1 MG PO TABS: 1.0000 mg | ORAL_TABLET | ORAL | Status: DC | PRN

## 2020-11-27 MED ORDER — ONDANSETRON 4 MG PO TBDP: 4.0000 mg | ORAL_TABLET | Freq: Four times a day (QID) | ORAL | Status: DC | PRN

## 2020-11-27 MED ORDER — POLYVINYL ALCOHOL 1.4 % OP SOLN
1.0000 [drp] | Freq: Four times a day (QID) | OPHTHALMIC | Status: DC | PRN
  Filled 2020-11-27: qty 15

## 2020-11-27 MED ORDER — BIOTENE DRY MOUTH MT LIQD: 15.0000 mL | OROMUCOSAL | Status: DC | PRN

## 2020-11-27 MED ORDER — MORPHINE SULFATE (CONCENTRATE) 10 MG/0.5ML PO SOLN
5.0000 mg | ORAL | Status: DC | PRN
Start: 2020-11-27 — End: 2020-11-28

## 2020-11-27 MED ORDER — ONDANSETRON HCL 4 MG/2ML IJ SOLN: 4.0000 mg | Freq: Four times a day (QID) | INTRAMUSCULAR | Status: DC | PRN

## 2020-11-27 MED ORDER — ACETAMINOPHEN 325 MG PO TABS: 650.0000 mg | ORAL_TABLET | Freq: Four times a day (QID) | ORAL | Status: DC | PRN

## 2020-11-27 MED ORDER — SODIUM CHLORIDE 0.9% FLUSH: 3.0000 mL | INTRAVENOUS | Status: DC | PRN

## 2020-11-27 MED ORDER — SODIUM CHLORIDE 0.9% FLUSH
3.0000 mL | Freq: Two times a day (BID) | INTRAVENOUS | Status: DC
  Administered 2020-11-27 – 2020-11-28 (×2): 3 mL via INTRAVENOUS

## 2020-11-27 NOTE — Consult Note (Signed)
Consultation Note Date: 11/27/2020   Patient Name: Benjamin [name]  DOB: 1966-02-24  MRN: [id number]  Age / Sex: 55 y.o., male  PCP: Glenda Chroman, MD Referring Physician: Enzo Bi, MD  Reason for Consultation: Establishing goals of care  HPI/Patient Profile: 55 y.o. male  with past medical history of TBI in 1986, seizures, and psychotic episodes/?schizophrenia admitted on 11/04/2020 with AMS. Imaging showed a mass in the left lingula and surrounding postobstructive pneumonitis. The mass extended into the left hilum and he had left hilar adenopathy.  He also had a mass in the right upper lobe.  MRI did not reveal any intracranial lesions. Patient is likely not a candidate for surgical intervention or chemo d/t his malnutrition. Patient has experienced significant weight loss with a BMI of 17. Patient with persistent encephalopathy throughout admission - now pleasantly confused and calm. PMT consulted to discuss [place].  Clinical Assessment and Goals of Care: I have reviewed medical records including EPIC notes, labs and imaging, received report from RN, assessed the patient and then met with his wife, Benjamin [name],  to discuss diagnosis prognosis, [place], EOL wishes, disposition and options.  Patient unable to participate in [place] conversation d/t confusion however he is pleasant and calm during my interaction with him.  I introduced Palliative Medicine as specialized medical care for people living with serious illness. It focuses on providing relief from the symptoms and stress of a serious illness. The goal is to improve quality of life for both the patient and the family.  We discussed a brief life review of the patient. Wife share with me they have been married ~ 17 years and she has served as his caregiver. She tells me he has been unable to work since 1997 r/t his TBI and seizures. She tells me they do have a child.   We discuss a functional and nutritional  decline. Patient is frail.    We discussed patient's current illness and what it means in the larger context of patient's on-going co-morbidities.  Natural disease trajectory and expectations at EOL were discussed. We discussed his lung cancer - discuss inability to obtain PET scan inpatient. Wife understands patient is not a candidate for chemo or surgery - she also states she would not want to put him through these options.  I attempted to elicit values and goals of care important to the patient.  Wife tells me she and her husband have had many conversations in the past about what kind of care they would want if they were ill - she is clear he would not want his life prolonged as she feels he no longer has good quality of life.   The difference between aggressive medical intervention and comfort care was considered in light of the patient's goals of care.   Advance directives, concepts specific to code status, artificial feeding and hydration, and rehospitalization were considered and discussed.   I completed a MOST form today. The patient and family outlined their wishes for the following treatment decisions:  Cardiopulmonary Resuscitation: Do Not Attempt Resuscitation (DNR/No CPR)  Medical Interventions: Comfort Measures: Keep clean, warm, and dry. Use medication by any route, positioning, wound care, and other measures to relieve pain and suffering. Use oxygen, suction and manual treatment of airway obstruction as needed for comfort. Do not transfer to the hospital unless comfort needs cannot be met in current location.  Antibiotics: Determine use of limitation of antibiotics when infection occurs  IV Fluids: No IV fluids (provide other measures to  ensure comfort)  Feeding Tube: No feeding tube   Wife is clear that she only wants to focus on Mr. Riera's comfort and quality of life - she is not interested in pursuing any further work up or cancer treatment. She does not want him to return to  the hospital once he is discharged.   Hospice services outpatient were explained and offered. Wife is interested in support of hospice to help ensure Mr. Bartnik is comfortable - she is open to their support as a hospice facility or a LTC facility.   Questions and concerns were addressed. The family was encouraged to call with questions or concerns.   Above was shared with patient's treatment team including oncology who agrees focusing on comfort and involving hospice is appropriate.   Primary Decision Maker NEXT OF KIN - wife Benjamin [name]    SUMMARY OF RECOMMENDATIONS   - wife would like to transition to focus on Mr. Brittian's comfort only - no more lab work, no further cancer work up, no pursuit of cancer treatment, no further hospitalizations. Code status change to DNR. - wife interested in hospice support - hospice facility or LTC facility with hospice - whichever can be arranged for Mr. Golebiewski - will continue current medication regimen for symptom management - will dc medications not needed to ensure his comfort  Code Status/Advance Care Planning: DNR  Prognosis:  Patient has poor prognosis r/t untreated lung cancer along with functional and nutritional decline, failure to thrive, ongoing encephalopathy   Discharge Planning: To Be Determined      Primary Diagnoses: Present on Admission:  Encephalopathy   I have reviewed the medical record, interviewed the patient and family, and examined the patient. The following aspects are pertinent.  Past Medical History:  Diagnosis Date   Anxiety    ED (erectile dysfunction)    Headache 06/01/2017   Seizure disorder ([place]) 06/01/2017   TBI (traumatic brain injury) ([place])    1986   Social History   Socioeconomic History   Marital status: Married    Spouse name: Not on file   Number of children: Not on file   Years of education: Not on file   Highest education level: Not on file  Occupational History   Not on file  Tobacco Use    Smoking status: Every Day    Packs/day: 1.00    Pack years: 0.00    Types: Cigarettes   Smokeless tobacco: Never  Substance and Sexual Activity   Alcohol use: No   Drug use: No   Sexual activity: Not on file  Other Topics Concern   Not on file  Social History Narrative   Not on file   Social Determinants of Health   Financial Resource Strain: Not on file  Food Insecurity: Not on file  Transportation Needs: Not on file  Physical Activity: Not on file  Stress: Not on file  Social Connections: Not on file   Family History  Problem Relation Age of Onset   Hypercholesterolemia Mother    Hypertension Mother    Hypertension Father    Heart attack Father    Schizophrenia Brother    Seizures Neg Hx    Scheduled Meds:  ALPRAZolam  1.5 mg Oral QHS   apixaban  2.5 mg Oral BID   feeding supplement  237 mL Oral QID   hydrOXYzine  50 mg Oral QHS   ibuprofen  400 mg Oral QID   lactulose  10 g Oral Daily   levETIRAcetam  500 mg Oral BID   memantine  5 mg Oral BID   mirtazapine  15 mg Oral QHS   multivitamin with minerals  1 tablet Oral Daily   ondansetron (ZOFRAN) IV  4 mg Intravenous Q6H   phenobarbital  129.6 mg Oral BID   polyethylene glycol  17 g Oral Daily   senna-docusate  2 tablet Oral BID   thiamine  100 mg Oral Daily   topiramate  100 mg Oral BID   ziprasidone  20 mg Oral BID WC   zolpidem  5 mg Oral QHS   Continuous Infusions: PRN Meds:.acetaminophen **OR** acetaminophen, ziprasidone Allergies  Allergen Reactions   Peanut-Containing Drug Products     Diverticulitis    Review of Systems  Unable to perform ROS: Mental status change   Physical Exam Constitutional:      Comments: Lethargic - mostly sleeping when I visit him  Pulmonary:     Effort: Pulmonary effort is normal.  Skin:    General: Skin is warm and dry.  Neurological:     Mental Status: He is disoriented.    Vital Signs: BP 112/68 (BP Location: Left Arm)   Pulse 78   Temp 98.2 F (36.8 C)  (Oral)   Resp 18   Ht $R'5\' 7"'io$  (1.702 m)   Wt 51.5 kg   SpO2 98%   BMI 17.78 kg/m  Pain Scale: 0-10 POSS *See Group Information*: 1-Acceptable,Awake and alert Pain Score: 0-No pain   SpO2: SpO2: 98 % O2 Device:SpO2: 98 % O2 Flow Rate: .   IO: Intake/output summary:  Intake/Output Summary (Last 24 hours) at 11/27/2020 1032 Last data filed at 11/26/2020 2126 Gross per 24 hour  Intake 1320 ml  Output 400 ml  Net 920 ml    LBM: Last BM Date: 11/26/20 Baseline Weight: Weight: 51.5 kg Most recent weight: Weight: 51.5 kg     Palliative Assessment/Data: PPS 40%   Flowsheet Rows    Flowsheet Row Most Recent Value  Intake Tab   Referral Department Hospitalist  Unit at Time of Referral Cardiac/Telemetry Unit  Palliative Care Primary Diagnosis Cancer  Date Notified 11/24/20  Palliative Care Type New Palliative care  Reason for referral Clarify Goals of Care  Date of Admission 11/04/20  Date first seen by Palliative Care 11/25/20  # of days Palliative referral response time 1 Day(s)  # of days IP prior to Palliative referral 20  Clinical Assessment   Psychosocial & Spiritual Assessment   Palliative Care Outcomes        Time Total: 110 minutes Greater than 50%  of this time was spent counseling and coordinating care related to the above assessment and plan.  Juel Burrow, DNP, AGNP-C Palliative Medicine Team [phone] Pager: [phone]

## 2020-11-27 NOTE — Progress Notes (Signed)
[facility] PROGRESS NOTE  Beauregard Jarrells DXA:[id number] DOB: [birth year] DOA: 11/04/2020 PCP: Glenda Chroman, MD  Status: Remains inpatient appropriate because:Altered mental status, Ongoing diagnostic testing needed not appropriate for outpatient work up, Unsafe d/c plan, and IV treatments appropriate due to intensity of illness or inability to take PO  Dispo: The patient is from: Home              Anticipated d/c is to: SNF              Patient currently is not medically stable to d/c.   Difficult to place patient Yes   Level of care: Telemetry Medical  Code Status: DNR Family Communication: Wife at bedside 6/24 DVT prophylaxis: Lovenox changed to Eliquis due to refusal to take injection.  6/24 now that comfort care will discontinue DVT prophylaxis. COVID vaccination status: Unknown    HPI: 55 y.o. male with medical history significant for seizure disorder who presented to [facility] emergency room with altered mental status.  His wife reported that he had been acting very erratically.  He was complaining of pain in his left foot.  Reportedly he had an injury to his left foot at home in the last day or 2 and has had pain in the foot since then.  He was found to have fracture of his left first toe.  Placed in a postop shoe in the emergency room.  He had an erratic behavior and was very anxious and uncooperative and he was dosed with Haldol Ativan and Benadryl in the emergency room.  Chest x-ray revealed a lingular pneumonia versus a central lung mass.  CT of his chest was obtained which showed a mass in the left lingula and surrounding postobstructive pneumonitis.  The mass extended into the left hilum and he had left hilar adenopathy.  He also had a mass in the right upper lobe.  MRI was obtained to make sure there is no metastasis that would be causing his symptoms.  MRI did not reveal any intracranial lesions.  Due to lack of specialists at [facility] for further work-up so  patient was transferred to [facility].  Since admission patient has had several working diagnoses to explain his altered mental status.  Because he was found to have an incidental lung mass on initial imaging there were concerns over possible paraneoplastic syndrome.  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  Was treated with 5 days of IVIG had any improvement in his mentation or psychiatric status.  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  Medications have been adjusted and patient's anxiety, agitation and wandering have significantly improved as his chronic severe insomnia.  On 6/24 palliative medicine team met with the patient's wife and patient does not have the ability or capacity to participate in end-of-life discussion.  Given patient's poor physical status as well as his inability to safely participate with aggressive treatment of cancer wife opted to pursue comfort measures only.  This decision was supported by Dr. Lindi Adie the oncologist.  Patient has been changed to a DNR and plan is to hopefully transition patient to residential hospice/[place] hospice once a bed is available.  Subjective: Patient sleeping.  Briefly awakened but went back to sleep.  Wife at bedside and all questions answered.   Objective: Vitals:   11/26/20 2005 11/27/20 0454  BP: 110/79 112/68  Pulse: 87 78  Resp: 18 18  Temp: 98.5 F (36.9 C) 98.2 F (36.8 C)  SpO2: 98% 98%    Intake/Output Summary (Last 24 hours) at 11/27/2020 0801 Last data filed at 11/26/2020 2126 Gross per 24 hour  Intake 1800 ml  Output 400 ml  Net 1400 ml    Filed Weights   11/04/20 2343 11/12/20 1040  Weight: 51.5 kg 51.5 kg    Exam: General: Calm and sleeping.  Very cachectic in appearance. Respiratory: Lung sounds are clear.  Room air.  No increased work of breathing while supine Cardiovascular: Normal heart sounds, no tachycardia.   Normotensive. Abdomen: LBM 6/23, soft and apparently nontender.  Drinking only ensures. Neurologic: Cranial nerves are intact.  No focal neurological deficits.  Patient able to ambulate independently at baseline Psychiatric: Sleeping today.  At baseline has flat affect with persistent confusion and perseveration on different topics.   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  Neurology has signed off -Continue oral Geodon as comfort medicine -Continue preadmission Remeron and memantine as comfort medicine  Insomnia -Longstanding history of intractable insomnia.  Continue Xanax 1.5 mg (preadmission dose) -Continue HS Vistaril with Ambien 5 mg -Above medication should be continued since these are comfort medications  Incidental lung nodule/small cell lung carcinoma -Pathology consistent with [facility] 6/24 after meeting with PMT decision made by wife to not pursue aggressive treatment of cancer and instead focus on comfort.  Plan is to discharge to residential hospice   Seizure disorder -Continue Keppra, phenobarbital and Topamax as comfort med to prevent unnecessary seizures  Abdominal pain/recent constipation -Continue scheduled Zofran, scheduled MiraLAX, Senokot and low-dose Chronulac with option to change or DC if patient develops diarrhea-focus should be on comfort  Severe protein calorie malnutrition 2/2 chronic illness, cancer as evidenced by significant weight loss, severe fat depletion and severe muscle depletion Body mass index is 17.78 kg/m.  Nutrition recommended the addition of Ensure Enlive twice daily Has transition to comfort care.  Nutritional focus will be  on what patient desires to eat    Data Reviewed: Basic Metabolic Panel: Recent Labs  Lab 11/23/20 0428  NA 140  K 4.2  CL 104  CO2 28  GLUCOSE 112*  BUN 48*  CREATININE 1.10  CALCIUM 9.4   Liver Function Tests: Recent Labs  Lab 11/23/20 0428  AST 18  ALT 16  ALKPHOS 68  BILITOT 0.4  PROT 7.6  ALBUMIN 3.2*   No results for input(s): LIPASE, AMYLASE in the last 168 hours. No results for input(s): AMMONIA in the last 168 hours. CBC: Recent Labs  Lab 11/23/20 0428  WBC 4.4  HGB 10.4*  HCT 32.3*  MCV 100.9*  PLT 279   Cardiac Enzymes: No results for input(s): CKTOTAL, CKMB, CKMBINDEX, TROPONINI in the last 168 hours. BNP (last 3 results) No results for input(s): BNP in the last 8760 hours.  ProBNP (last 3 results) No results for input(s): PROBNP in the last 8760 hours.  CBG: No results for input(s): GLUCAP in the last 168 hours.  No results found for this or any previous visit (from the past 240 hour(s)).    Studies: MR BRAIN W WO CONTRAST  Result Date: 11/25/2020 CLINICAL DATA:  Concern for potential paraneoplastic limbic encephalitis. Lung mass. EXAM: MRI HEAD WITHOUT AND WITH CONTRAST TECHNIQUE: Multiplanar,  multiecho pulse sequences of the brain and surrounding structures were obtained without and with intravenous contrast. CONTRAST:  65mL GADAVIST GADOBUTROL 1 MMOL/ML IV SOLN COMPARISON:  MRI 11/04/2020 and limited/incomplete MRI 11/22/2020. FINDINGS: Brain: Motion limited diffusion-weighted imaging without evidence of obvious acute infarct. No evidence of hydrocephalus, acute hemorrhage, mass lesion, abnormal mass effect, or extra-axial fluid collection. No abnormal enhancement. Mild periventricular T2/FLAIR hyperintensities, nonspecific but most likely related to chronic microvascular ischemic disease. No abnormal enhancement on the motion limited postcontrast imaging. Vascular: Major arterial flow voids are maintained at the skull base. Skull and upper  cervical spine: Normal marrow signal. Sinuses/Orbits: Sinuses are largely clear.  Unremarkable orbits. Other: No mastoid effusions. IMPRESSION: No evidence of acute intracranial abnormality or metastatic disease on this motion limited study. Electronically Signed   By: Margaretha Sheffield MD   On: 11/25/2020 13:54    Scheduled Meds:  ALPRAZolam  1.5 mg Oral QHS   apixaban  2.5 mg Oral BID   feeding supplement  237 mL Oral QID   hydrOXYzine  50 mg Oral QHS   ibuprofen  400 mg Oral QID   lactulose  10 g Oral Daily   levETIRAcetam  500 mg Oral BID   memantine  5 mg Oral BID   mirtazapine  15 mg Oral QHS   multivitamin with minerals  1 tablet Oral Daily   ondansetron (ZOFRAN) IV  4 mg Intravenous Q6H   phenobarbital  129.6 mg Oral BID   polyethylene glycol  17 g Oral Daily   senna-docusate  2 tablet Oral BID   thiamine  100 mg Oral Daily   topiramate  100 mg Oral BID   ziprasidone  20 mg Oral BID WC   zolpidem  5 mg Oral QHS   Continuous Infusions:    Principal Problem:   Encephalopathy acute Active Problems:   Seizure disorder ([place])   Lung neoplasm   Other fracture of left great toe, initial encounter for closed fracture   Encephalopathy   Autoimmune encephalitis   Severe protein-calorie malnutrition [facility])   Consultants: Psychiatry Pulmonary medicine Neurology Oncology Palliative medicine  Procedures: Lumbar puncture performed 6/9  Antibiotics: None   Time spent: 25 minutes    Erin Hearing ANP  [facility] 7 am - 330 pm/M-F for direct patient care and secure chat Please refer to Amion for contact info 23  days

## 2020-11-27 NOTE — Plan of Care (Signed)
  Problem: Health Behavior/Discharge Planning: Goal: Ability to manage health-related needs will improve Outcome: Progressing   Problem: Clinical Measurements: Goal: Ability to maintain clinical measurements within normal limits will improve Outcome: Progressing Goal: Will remain free from infection Outcome: Progressing Goal: Diagnostic test results will improve Outcome: Progressing Goal: Respiratory complications will improve Outcome: Progressing Goal: Cardiovascular complication will be avoided Outcome: Progressing   Problem: Activity: Goal: Risk for activity intolerance will decrease Outcome: Progressing   Problem: Nutrition: Goal: Adequate nutrition will be maintained Outcome: Progressing   Problem: Coping: Goal: Level of anxiety will decrease Outcome: Progressing   Problem: Elimination: Goal: Will not experience complications related to bowel motility Outcome: Progressing Goal: Will not experience complications related to urinary retention Outcome: Progressing   Problem: Pain Managment: Goal: General experience of comfort will improve Outcome: Progressing   Problem: Safety: Goal: Ability to remain free from injury will improve Outcome: Progressing   Problem: Skin Integrity: Goal: Risk for impaired skin integrity will decrease Outcome: Progressing   Problem: Education: Goal: Knowledge of General Education information will improve Description: Including pain rating scale, medication(s)/side effects and non-pharmacologic comfort measures Outcome: Not Progressing   

## 2020-11-27 NOTE — Progress Notes (Signed)
CSW spoke with Aldona Bar at [facility] to make referral for inpatient hospice services.  Madilyn Fireman, MSW, LCSW Transitions of Care  Clinical Social Worker II [phone]

## 2020-11-28 LAB — SARS CORONAVIRUS 2 BY RT PCR (HOSPITAL ORDER, PERFORMED IN ~~LOC~~ HOSPITAL LAB): SARS Coronavirus 2: NEGATIVE

## 2020-11-28 MED ORDER — HYDROXYZINE HCL 50 MG PO TABS: 50.0000 mg | ORAL_TABLET | Freq: Every day | ORAL | 0 refills | Status: AC

## 2020-11-28 MED ORDER — ENSURE ENLIVE PO LIQD: 237.0000 mL | Freq: Two times a day (BID) | ORAL | 12 refills | Status: AC

## 2020-11-28 MED ORDER — IBUPROFEN 400 MG PO TABS: 400.0000 mg | ORAL_TABLET | Freq: Four times a day (QID) | ORAL | 0 refills | Status: AC

## 2020-11-28 MED ORDER — ONDANSETRON HCL 4 MG PO TABS: 4.0000 mg | ORAL_TABLET | Freq: Four times a day (QID) | ORAL | Status: AC

## 2020-11-28 MED ORDER — ZIPRASIDONE HCL 20 MG PO CAPS: 20.0000 mg | ORAL_CAPSULE | Freq: Two times a day (BID) | ORAL | Status: AC

## 2020-11-28 MED ORDER — ALPRAZOLAM 0.5 MG PO TABS: 1.5000 mg | ORAL_TABLET | Freq: Every day | ORAL | 0 refills | Status: AC

## 2020-11-28 MED ORDER — ZOLPIDEM TARTRATE 5 MG PO TABS: 5.0000 mg | ORAL_TABLET | Freq: Every evening | ORAL | 0 refills | Status: DC | PRN

## 2020-11-28 MED ORDER — HALOPERIDOL 0.5 MG PO TABS: 0.5000 mg | ORAL_TABLET | ORAL | Status: AC | PRN

## 2020-11-28 MED ORDER — ZOLPIDEM TARTRATE 5 MG PO TABS: 5.0000 mg | ORAL_TABLET | Freq: Every day | ORAL | 0 refills | Status: AC

## 2020-11-28 NOTE — Progress Notes (Signed)
Report called to Benjamin [name] at [facility].  [facility] called and will come get patient when available. Gathering patient belongings and faxing discharge summary.

## 2020-11-28 NOTE — Discharge Summary (Signed)
Physician Discharge Summary   Benjamin [name]  male DOB: [birth year]  DHR:[id number]  PCP: Glenda Chroman, MD  Admit date: 11/04/2020 Discharge date: 11/28/2020  Admitted From: home Disposition:  hospice facility CODE STATUS: DNR  comfort care measures  Discharge Instructions     No wound care   Complete by: As directed         Hospital Course:  For full details, please see H&P, progress notes, consult notes and ancillary notes.  Briefly,  Benjamin [name] is a 55 y.o. male with medical history significant for seizure disorder who presented to [facility] emergency room with altered mental status.  His wife reported that he had been acting very erratically.  He was complaining of pain in his left foot.  Reportedly he had an injury to his left foot at home in the last day or 2 and has had pain in the foot since then.  He was found to have fracture of his left first toe.  Placed in a postop shoe in the emergency room.  He had an erratic behavior and was very anxious and uncooperative and he was dosed with Haldol Ativan and Benadryl in the emergency room.  CT of his chest was obtained which showed a mass in the left lingula and surrounding postobstructive pneumonitis.  The mass extended into the left hilum and he had left hilar adenopathy.  He also had a mass in the right upper lobe.  MRI did not reveal any intracranial lesions.  Due to lack of specialists at [facility] for further work-up so patient was transferred to [facility]   Since admission patient has had several working diagnoses to explain his altered mental status.  Because he was found to have an incidental lung mass on initial imaging there were concerns over possible paraneoplastic syndrome.  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  Was treated with 5 days of IVIG.  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  Medications have been adjusted and  patient's anxiety, agitation and wandering have significantly improved as his chronic severe insomnia.   On 6/24 palliative medicine team met with the patient's wife and patient does not have the ability or capacity to participate in end-of-life discussion.  Given patient's poor physical status as well as his inability to safely participate with aggressive treatment of cancer, wife opted to pursue comfort measures only.  This decision was supported by Dr. Lindi Adie the oncologist.  Patient has been changed to a DNR and plan is to transition patient to residential hospice/[facility] once a bed is available.  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  Neurology has signed off -Continue oral Geodon as comfort medicine -Continue preadmission Remeron and memantine as comfort medicine   Insomnia -Longstanding history of intractable insomnia.  Continue Xanax 1.5 mg (preadmission dose) -Continue HS Vistaril with Ambien 5 mg -Above medication should be continued since these are comfort medications   Incidental lung nodule/small cell lung carcinoma -Pathology consistent with [facility] 6/24 after meeting with PMT decision made by wife to not pursue aggressive treatment of cancer and instead focus on comfort.  Plan is to discharge to residential hospice   Seizure disorder -Continue Keppra, phenobarbital and Topamax  as comfort med to prevent unnecessary seizures   Abdominal pain/recent constipation -Continue scheduled Zofran. Pt started having diarrhea from drinking too much Ensure, so scheduled laxatives were d/c'ed.   Severe protein calorie malnutrition 2/2 chronic illness, cancer as evidenced by significant weight loss,  severe fat depletion and severe muscle depletion Body mass index is 17.78 kg/m.  Nutrition recommended the addition of Ensure Enlive twice daily Has transition to comfort care.  Nutritional focus will be on what patient desires to eat     Discharge Diagnoses:  Principal Problem:   Encephalopathy acute Active Problems:   Seizure disorder ([place])   Lung neoplasm   Other fracture of left great toe, initial encounter for closed fracture   Encephalopathy   Autoimmune encephalitis   Severe protein-calorie malnutrition ([place])   30 Day Unplanned Readmission Risk Score    Flowsheet Row Admission (Current) from 11/04/2020 in [facility]  30 Day Unplanned Readmission Risk Score (%) 12.9 Filed at 11/28/2020 1200       This score is the patient's risk of an unplanned readmission within 30 days of being discharged (0 -100%). The score is based on dignosis, age, lab data, medications, orders, and past utilization.   Low:  0-14.9   Medium: 15-21.9   High: 22-29.9   Extreme: 30 and above          Discharge Instructions:  Allergies as of 11/28/2020       Reactions   Peanut-containing Drug Products    Diverticulitis         Medication List     STOP taking these medications    amitriptyline 25 MG tablet Commonly known as: ELAVIL   ARIPiprazole 10 MG tablet Commonly known as: ABILIFY   cyclobenzaprine 10 MG tablet Commonly known as: FLEXERIL   ketorolac 10 MG tablet Commonly known as: TORADOL   memantine 5 MG tablet Commonly known as: NAMENDA   omeprazole 40 MG capsule Commonly known as: PRILOSEC   pantoprazole 40 MG tablet Commonly known as: PROTONIX   sildenafil 20 MG tablet Commonly known as: REVATIO       TAKE these medications    ALPRAZolam 0.5 MG tablet Commonly known as: XANAX Take 3 tablets (1.5 mg total) by mouth at bedtime. What changed:  how much to take when to take this reasons to take this   feeding supplement  Liqd Take 237 mLs by mouth 2 (two) times daily between meals.   haloperidol 0.5 MG tablet Commonly known as: HALDOL Take 1 tablet (0.5 mg total) by mouth every 4 (four) hours as needed for agitation (or delirium).   hydrOXYzine 50 MG tablet Commonly known as: ATARAX/VISTARIL Take 1 tablet (50 mg total) by mouth at bedtime.   ibuprofen 400 MG tablet Commonly known as: ADVIL Take 1 tablet (400 mg total) by mouth 4 (four) times daily. What changed:  medication strength how much to take when to take this   levETIRAcetam 500 MG tablet Commonly known as: Keppra Take 1 tablet twice daily What changed:  how much to take how to take this when to take this additional instructions   mirtazapine 15 MG tablet Commonly known as: REMERON Take 15 mg by mouth at bedtime.   ondansetron 4 MG tablet Commonly known as: Zofran Take 1 tablet (4 mg total) by mouth every 6 (six) hours.   PHENobarbital 64.8 MG tablet Commonly known as: LUMINAL Take 2 tablets (129.6 mg total) by mouth 2 (two) times daily.  topiramate 100 MG tablet Commonly known as: TOPAMAX Take 1 tablet (100 mg total) by mouth 2 (two) times daily.   ziprasidone 20 MG capsule Commonly known as: GEODON Take 1 capsule (20 mg total) by mouth 2 (two) times daily with a meal.   zolpidem 5 MG tablet Commonly known as: AMBIEN Take 1 tablet (5 mg total) by mouth at bedtime.          Allergies  Allergen Reactions   Peanut-Containing Drug Products     Diverticulitis      The results of significant diagnostics from this hospitalization (including imaging, microbiology, ancillary and laboratory) are listed below for reference.   Consultations:   Procedures/Studies: MR BRAIN WO CONTRAST  Result Date: 11/22/2020 CLINICAL DATA:  Neuro deficit, acute, stroke suspected. Seizure disorder. Altered mental status. The examination had to be discontinued prior to completion due to patient refused imaging beyond the diffusion  sequences. EXAM: MRI HEAD WITHOUT CONTRAST TECHNIQUE: Multiplanar, multiecho pulse sequences of the brain and surrounding structures were obtained without intravenous contrast. COMPARISON:  MR head without contrast 07/10/2017. MR head without and with contrast 11/04/2020 at [facility]: Brain: Diffusion weighted images demonstrate no acute or subacute infarction. No restricted diffusion is present. IMPRESSION: Patient refused imaging beyond diffusion-weighted sequences. No acute or subacute infarction is present. No cortical restricted diffusion to suggest recent seizure activity. Electronically Signed   By: San Morelle M.D.   On: 11/22/2020 17:01   MR BRAIN W WO CONTRAST  Result Date: 11/25/2020 CLINICAL DATA:  Concern for potential paraneoplastic limbic encephalitis. Lung mass. EXAM: MRI HEAD WITHOUT AND WITH CONTRAST TECHNIQUE: Multiplanar, multiecho pulse sequences of the brain and surrounding structures were obtained without and with intravenous contrast. CONTRAST:  106m GADAVIST GADOBUTROL 1 MMOL/ML IV SOLN COMPARISON:  MRI 11/04/2020 and limited/incomplete MRI 11/22/2020. FINDINGS: Brain: Motion limited diffusion-weighted imaging without evidence of obvious acute infarct. No evidence of hydrocephalus, acute hemorrhage, mass lesion, abnormal mass effect, or extra-axial fluid collection. No abnormal enhancement. Mild periventricular T2/FLAIR hyperintensities, nonspecific but most likely related to chronic microvascular ischemic disease. No abnormal enhancement on the motion limited postcontrast imaging. Vascular: Major arterial flow voids are maintained at the skull base. Skull and upper cervical spine: Normal marrow signal. Sinuses/Orbits: Sinuses are largely clear.  Unremarkable orbits. Other: No mastoid effusions. IMPRESSION: No evidence of acute intracranial abnormality or metastatic disease on this motion limited study. Electronically Signed   By: FMargaretha SheffieldMD   On:  11/25/2020 13:54   IR Fluoro Guide Ndl Plmt / BX  Result Date: 11/12/2020 CLINICAL DATA:  JBilaal Leibis a 55y.o. male with PMH of headache, anxiety, TBI in 1986, and seizure disorder who presented to OSH due to AMS [facility]. During admission at the OSH, a CT chest showed a mass in the left lingula and surrounding postobstructive pneumonitis. MR head was obtained to rule out metastasis, and it showed no acute intracranial process. 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 After thorough discussion and shared decision making, patient's wife decided to proceed with the lumbar puncture. EXAM: DIAGNOSTIC LUMBAR PUNCTURE UNDER FLUOROSCOPIC GUIDANCE COMPARISON:  None. FLUOROSCOPY TIME:  Fluoroscopy Time:  1.5 minute Radiation Exposure Index (if provided by the fluoroscopic device): 366m Number of Acquired Spot Images: 8 PROCEDURE: Informed consent was obtained from the patient's wide prior to the procedure, including potential complications of headache, allergy, and pain. The procedure  was performed under general anesthesia. With the patient prone, the lower back was prepped with Betadine. Lumbar puncture was performed at the L2-3 and L3-4 levels using a 16 gauge needle with return of clear CSF with an opening pressure of 11 cm water. Ten ml of CSF were obtained for laboratory studies. The patient tolerated the procedure well and there were no apparent complications. IMPRESSION: Successful fluoroscopy guided lumbar puncture. Electronically Signed   By: Pedro Earls M.D.   On: 11/12/2020 14:47   DG Abd Portable 1V  Result Date: 11/24/2020 CLINICAL DATA:  Constipation. EXAM: PORTABLE ABDOMEN - 1 VIEW COMPARISON:  No recent prior. FINDINGS: Soft tissue structures are unremarkable. Moderate stool volume. Mild gastric distention cannot be excluded. No small bowel or  colonic distention. No free air. Prominent thoracolumbar spine scoliosis and degenerative change. Degenerative changes both hips. IMPRESSION: 1. Moderate stool volume. No small-bowel colonic distention. No free air. 2. Mild gastric distention cannot be excluded. Electronically Signed   By: Marcello Moores  Register   On: 11/24/2020 15:43   EEG adult  Result Date: 11/05/2020 Lora Havens, MD     11/05/2020 11:55 AM Patient Name: Merwin Breden MRN: [id number] Epilepsy Attending: Lora Havens Referring Physician/Provider: Dr Harrold Donath Date: 11/05/2020 Duration: 22.32 mins Patient history: 55yo M with h/o seizure presented with AMS. EEG to evaluate for seizure Level of alertness: Awake, asleep AEDs during EEG study: LEV Technical aspects: This EEG study was done with scalp electrodes positioned according to the 10-20 International system of electrode placement. Electrical activity was acquired at a sampling rate of _0  and reviewed with a high frequency filter of _1  and a low frequency filter of _2 . EEG data were recorded continuously and digitally stored. Description: The posterior dominant rhythm consists of 8-9 Hz activity of moderate voltage (25-35 uV) seen predominantly in posterior head regions, symmetric and reactive to eye opening and eye closing. Sleep was characterized by vertex waves, sleep spindles (12 to 14 Hz), maximal frontocentral region.  EEG showed intermittent generalized 3 to 6 Hz theta-delta slowing. Hyperventilation and photic stimulation were not performed.   ABNORMALITY - Intermittent slow, generalized IMPRESSION: This study is suggestive of mild diffuse encephalopathy, nonspecific etiology. No seizures or epileptiform discharges were seen throughout the recording. [place]: BNP (last 3 results) No results for input(s): BNP in the last 8760 hours. Basic Metabolic Panel: Recent Labs  Lab 11/23/20 0428  NA 140  K 4.2  CL 104  CO2 28  GLUCOSE 112*  BUN 48*   CREATININE 1.10  CALCIUM 9.4   Liver Function Tests: Recent Labs  Lab 11/23/20 0428  AST 18  ALT 16  ALKPHOS 68  BILITOT 0.4  PROT 7.6  ALBUMIN 3.2*   No results for input(s): LIPASE, AMYLASE in the last 168 hours. No results for input(s): AMMONIA in the last 168 hours. CBC: Recent Labs  Lab 11/23/20 0428  WBC 4.4  HGB 10.4*  HCT 32.3*  MCV 100.9*  PLT 279   Cardiac Enzymes: No results for input(s): CKTOTAL, CKMB, CKMBINDEX, TROPONINI in the last 168 hours. BNP: Invalid input(s): POCBNP CBG: No results for input(s): GLUCAP in the last 168 hours. D-Dimer No results for input(s): DDIMER in the last 72 hours. Hgb A1c No results for input(s): HGBA1C in the last 72 hours. Lipid Profile No results for input(s): CHOL, HDL, LDLCALC, TRIG, CHOLHDL, LDLDIRECT in the last 72 hours. Thyroid function studies No results for input(s):  TSH, T4TOTAL, T3FREE, THYROIDAB in the last 72 hours.  Invalid input(s): FREET3 Anemia work up No results for input(s): VITAMINB12, FOLATE, FERRITIN, TIBC, IRON, RETICCTPCT in the last 72 hours. Urinalysis No results found for: COLORURINE, APPEARANCEUR, [place], [place], [place], [place], [place], [place], PROTEINUR, UROBILINOGEN, NITRITE, LEUKOCYTESUR Sepsis Labs Invalid input(s): PROCALCITONIN,  WBC,  LACTICIDVEN Microbiology Recent Results (from the past 240 hour(s))  SARS Coronavirus 2 by RT PCR (hospital order, performed in [place] hospital lab)     Status: None   Collection Time: 11/28/20  1:45 PM  Result Value Ref Range Status   SARS Coronavirus 2 NEGATIVE NEGATIVE Final    Comment: (NOTE) SARS-CoV-2 target nucleic acids are NOT DETECTED.  The SARS-CoV-2 RNA is generally detectable in upper and lower respiratory specimens during the acute phase of infection. The lowest concentration of SARS-CoV-2 viral copies this assay can detect is 250 copies / mL. A negative result does not preclude SARS-CoV-2 infection and should not  be used as the sole basis for treatment or other patient management decisions.  A negative result may occur with improper specimen collection / handling, submission of specimen other than nasopharyngeal swab, presence of viral mutation(s) within the areas targeted by this assay, and inadequate number of viral copies (<250 copies / mL). A negative result must be combined with clinical observations, patient history, and epidemiological information.  Fact Sheet for Patients:   StrictlyIdeas.no  Fact Sheet for Healthcare Providers: BankingDealers.co.za  This test is not yet approved or  cleared by the [place] FDA and has been authorized for detection and/or diagnosis of SARS-CoV-2 by FDA under an Emergency Use Authorization (EUA).  This EUA will remain in effect (meaning this test can be used) for the duration of the COVID-19 declaration under Section 564(b)(1) of the Act, 21 U.S.C. section 360bbb-3(b)(1), unless the authorization is terminated or revoked sooner.  Performed at [facility], [place] [address], [place], [place] [address]      Total time spend on discharging this patient, including the last patient exam, discussing the hospital stay, instructions for ongoing care as it relates to all pertinent caregivers, as well as preparing the medical discharge records, prescriptions, and/or referrals as applicable, is 30 minutes.    Enzo Bi, MD  [facility] 11/28/2020, 3:09 PM

## 2020-11-28 NOTE — Plan of Care (Signed)
  Problem: Health Behavior/Discharge Planning: Goal: Ability to manage health-related needs will improve Outcome: Progressing   Problem: Clinical Measurements: Goal: Ability to maintain clinical measurements within normal limits will improve Outcome: Progressing Goal: Will remain free from infection Outcome: Progressing Goal: Diagnostic test results will improve Outcome: Progressing Goal: Respiratory complications will improve Outcome: Progressing Goal: Cardiovascular complication will be avoided Outcome: Progressing   Problem: Activity: Goal: Risk for activity intolerance will decrease Outcome: Progressing   Problem: Nutrition: Goal: Adequate nutrition will be maintained Outcome: Progressing   Problem: Coping: Goal: Level of anxiety will decrease Outcome: Progressing   Problem: Elimination: Goal: Will not experience complications related to bowel motility Outcome: Progressing Goal: Will not experience complications related to urinary retention Outcome: Progressing   Problem: Pain Managment: Goal: General experience of comfort will improve Outcome: Progressing   Problem: Safety: Goal: Ability to remain free from injury will improve Outcome: Progressing   Problem: Skin Integrity: Goal: Risk for impaired skin integrity will decrease Outcome: Progressing   Problem: Education: Goal: Knowledge of General Education information will improve Description: Including pain rating scale, medication(s)/side effects and non-pharmacologic comfort measures Outcome: Not Progressing   

## 2020-11-28 NOTE — TOC Transition Note (Signed)
Transition of Care [facility]) - CM/SW Discharge Note   Patient Details  Name: Riley Hallum MRN: [id number] Date of Birth: August 13, 1965  [facility] CM/SW Contact:  Coralee Pesa, [place] Phone Number: 11/28/2020, 4:06 PM   Clinical Narrative:    Pt to be [facility] via [place].  Nurse to call report to [phone].  Pt can be transported at anytime, no cutoff at the facility. Please contact wife when pt is picked up.   Final next level of care: [place] Barriers to Discharge: Barriers Resolved   Patient Goals and CMS Choice Patient states their goals for this hospitalization and ongoing recovery are:: Patient waiting for EBUS and wife requesting SNF placement at this time. CMS Medicare.gov Compare Post Acute Care list provided to:: Patient    Discharge Placement              Patient chooses bed at:  [facility]) Patient to be transferred to facility by: [place] Name of family member notified: [name] Patient and family notified of of transfer: 11/28/20  Discharge Plan and Services In-house Referral: Clinical Social Work Discharge Planning Services: CM Consult Post Acute Care Choice: [place]                               Social Determinants of Health (SDOH) Interventions     Readmission Risk Interventions No flowsheet data found.

## 2021-01-04 DEATH — deceased
